# Patient Record
Sex: Female | Born: 1960 | Race: White | Hispanic: No | Marital: Married | Smoking: Never smoker
Health system: Southern US, Community
[De-identification: ages and names within clinical notes are randomized; demographics above are authoritative.]

## PROBLEM LIST (undated history)

## (undated) DIAGNOSIS — E079 Disorder of thyroid, unspecified: Secondary | ICD-10-CM

## (undated) DIAGNOSIS — J309 Allergic rhinitis, unspecified: Secondary | ICD-10-CM

## (undated) DIAGNOSIS — F39 Unspecified mood [affective] disorder: Secondary | ICD-10-CM

## (undated) DIAGNOSIS — Z9889 Other specified postprocedural states: Secondary | ICD-10-CM

## (undated) DIAGNOSIS — F419 Anxiety disorder, unspecified: Secondary | ICD-10-CM

## (undated) DIAGNOSIS — F3281 Premenstrual dysphoric disorder: Secondary | ICD-10-CM

## (undated) DIAGNOSIS — I872 Venous insufficiency (chronic) (peripheral): Secondary | ICD-10-CM

## (undated) DIAGNOSIS — I700 Atherosclerosis of aorta: Secondary | ICD-10-CM

## (undated) DIAGNOSIS — N183 Chronic kidney disease, stage 3 unspecified: Secondary | ICD-10-CM

## (undated) DIAGNOSIS — R112 Nausea with vomiting, unspecified: Secondary | ICD-10-CM

## (undated) HISTORY — DX: Unspecified mood (affective) disorder: F39

## (undated) HISTORY — DX: Allergic rhinitis, unspecified: J30.9

## (undated) HISTORY — PX: TUBAL LIGATION: SHX77

## (undated) HISTORY — DX: Chronic kidney disease, stage 3 unspecified: N18.30

## (undated) HISTORY — DX: Venous insufficiency (chronic) (peripheral): I87.2

## (undated) HISTORY — DX: Premenstrual dysphoric disorder: F32.81

## (undated) HISTORY — PX: TONSILLECTOMY: SUR1361

## (undated) HISTORY — PX: COLONOSCOPY: SHX174

## (undated) HISTORY — PX: APPENDECTOMY: SHX54

## (undated) HISTORY — DX: Atherosclerosis of aorta: I70.0

## (undated) HISTORY — PX: DILATION AND CURETTAGE OF UTERUS: SHX78

---

## 1998-04-18 ENCOUNTER — Other Ambulatory Visit: Admission: RE | Admit: 1998-04-18 | Discharge: 1998-04-18 | Payer: Self-pay | Admitting: Obstetrics & Gynecology

## 1999-03-20 ENCOUNTER — Encounter: Payer: Self-pay | Admitting: Obstetrics & Gynecology

## 1999-03-20 ENCOUNTER — Ambulatory Visit (HOSPITAL_COMMUNITY): Admission: RE | Admit: 1999-03-20 | Discharge: 1999-03-20 | Payer: Self-pay | Admitting: Obstetrics & Gynecology

## 1999-04-22 ENCOUNTER — Other Ambulatory Visit: Admission: RE | Admit: 1999-04-22 | Discharge: 1999-04-22 | Payer: Self-pay | Admitting: Obstetrics & Gynecology

## 2000-05-19 ENCOUNTER — Other Ambulatory Visit: Admission: RE | Admit: 2000-05-19 | Discharge: 2000-05-19 | Payer: Self-pay | Admitting: Obstetrics & Gynecology

## 2001-06-15 ENCOUNTER — Other Ambulatory Visit: Admission: RE | Admit: 2001-06-15 | Discharge: 2001-06-15 | Payer: Self-pay | Admitting: Obstetrics & Gynecology

## 2002-06-28 ENCOUNTER — Other Ambulatory Visit: Admission: RE | Admit: 2002-06-28 | Discharge: 2002-06-28 | Payer: Self-pay | Admitting: Obstetrics & Gynecology

## 2002-07-22 ENCOUNTER — Encounter (INDEPENDENT_AMBULATORY_CARE_PROVIDER_SITE_OTHER): Payer: Self-pay | Admitting: *Deleted

## 2002-07-22 ENCOUNTER — Ambulatory Visit (HOSPITAL_COMMUNITY): Admission: RE | Admit: 2002-07-22 | Discharge: 2002-07-22 | Payer: Self-pay | Admitting: Obstetrics & Gynecology

## 2003-07-05 ENCOUNTER — Other Ambulatory Visit: Admission: RE | Admit: 2003-07-05 | Discharge: 2003-07-05 | Payer: Self-pay | Admitting: Obstetrics & Gynecology

## 2003-12-01 ENCOUNTER — Emergency Department (HOSPITAL_COMMUNITY): Admission: EM | Admit: 2003-12-01 | Discharge: 2003-12-01 | Payer: Self-pay | Admitting: Family Medicine

## 2004-06-29 ENCOUNTER — Emergency Department (HOSPITAL_COMMUNITY): Admission: EM | Admit: 2004-06-29 | Discharge: 2004-06-29 | Payer: Self-pay | Admitting: Family Medicine

## 2004-08-13 ENCOUNTER — Other Ambulatory Visit: Admission: RE | Admit: 2004-08-13 | Discharge: 2004-08-13 | Payer: Self-pay | Admitting: Obstetrics & Gynecology

## 2004-08-21 ENCOUNTER — Encounter: Admission: RE | Admit: 2004-08-21 | Discharge: 2004-08-21 | Payer: Self-pay | Admitting: Obstetrics & Gynecology

## 2005-09-09 ENCOUNTER — Other Ambulatory Visit: Admission: RE | Admit: 2005-09-09 | Discharge: 2005-09-09 | Payer: Self-pay | Admitting: Obstetrics & Gynecology

## 2005-09-26 ENCOUNTER — Encounter: Admission: RE | Admit: 2005-09-26 | Discharge: 2005-09-26 | Payer: Self-pay | Admitting: Obstetrics & Gynecology

## 2007-05-24 ENCOUNTER — Ambulatory Visit (HOSPITAL_COMMUNITY): Admission: RE | Admit: 2007-05-24 | Discharge: 2007-05-24 | Payer: Self-pay | Admitting: Orthopedic Surgery

## 2008-04-28 ENCOUNTER — Encounter: Admission: RE | Admit: 2008-04-28 | Discharge: 2008-04-28 | Payer: Self-pay | Admitting: Obstetrics & Gynecology

## 2008-12-24 IMAGING — MG MM DIAGNOSTIC UNILATERAL L
3 series · 3 of 3 positions shown · non-contrast
Comparison: With priors

CLINICAL DATA: Abnormal left screening mammogram

DIGITAL DIAGNOSTIC  LEFT  MAMMOGRAM  WITH CAD AND LEFT BREAST
ULTRASOUND:

[L CC]
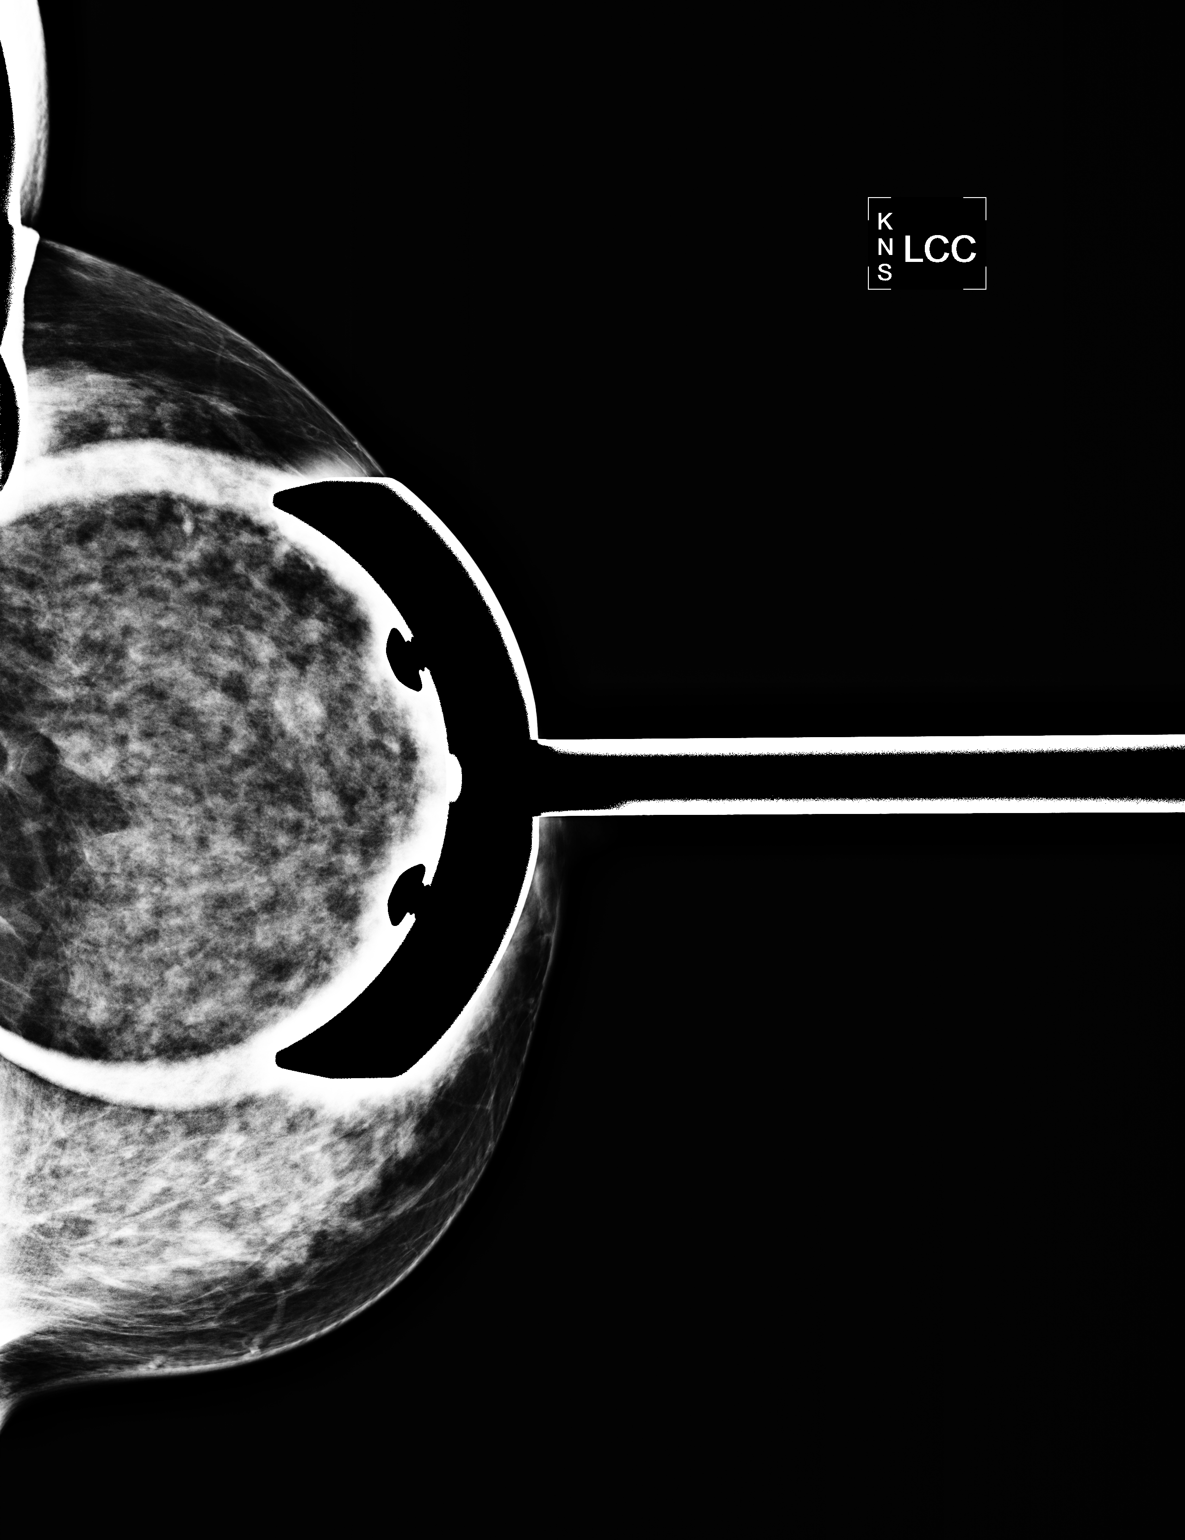

[L MLO]
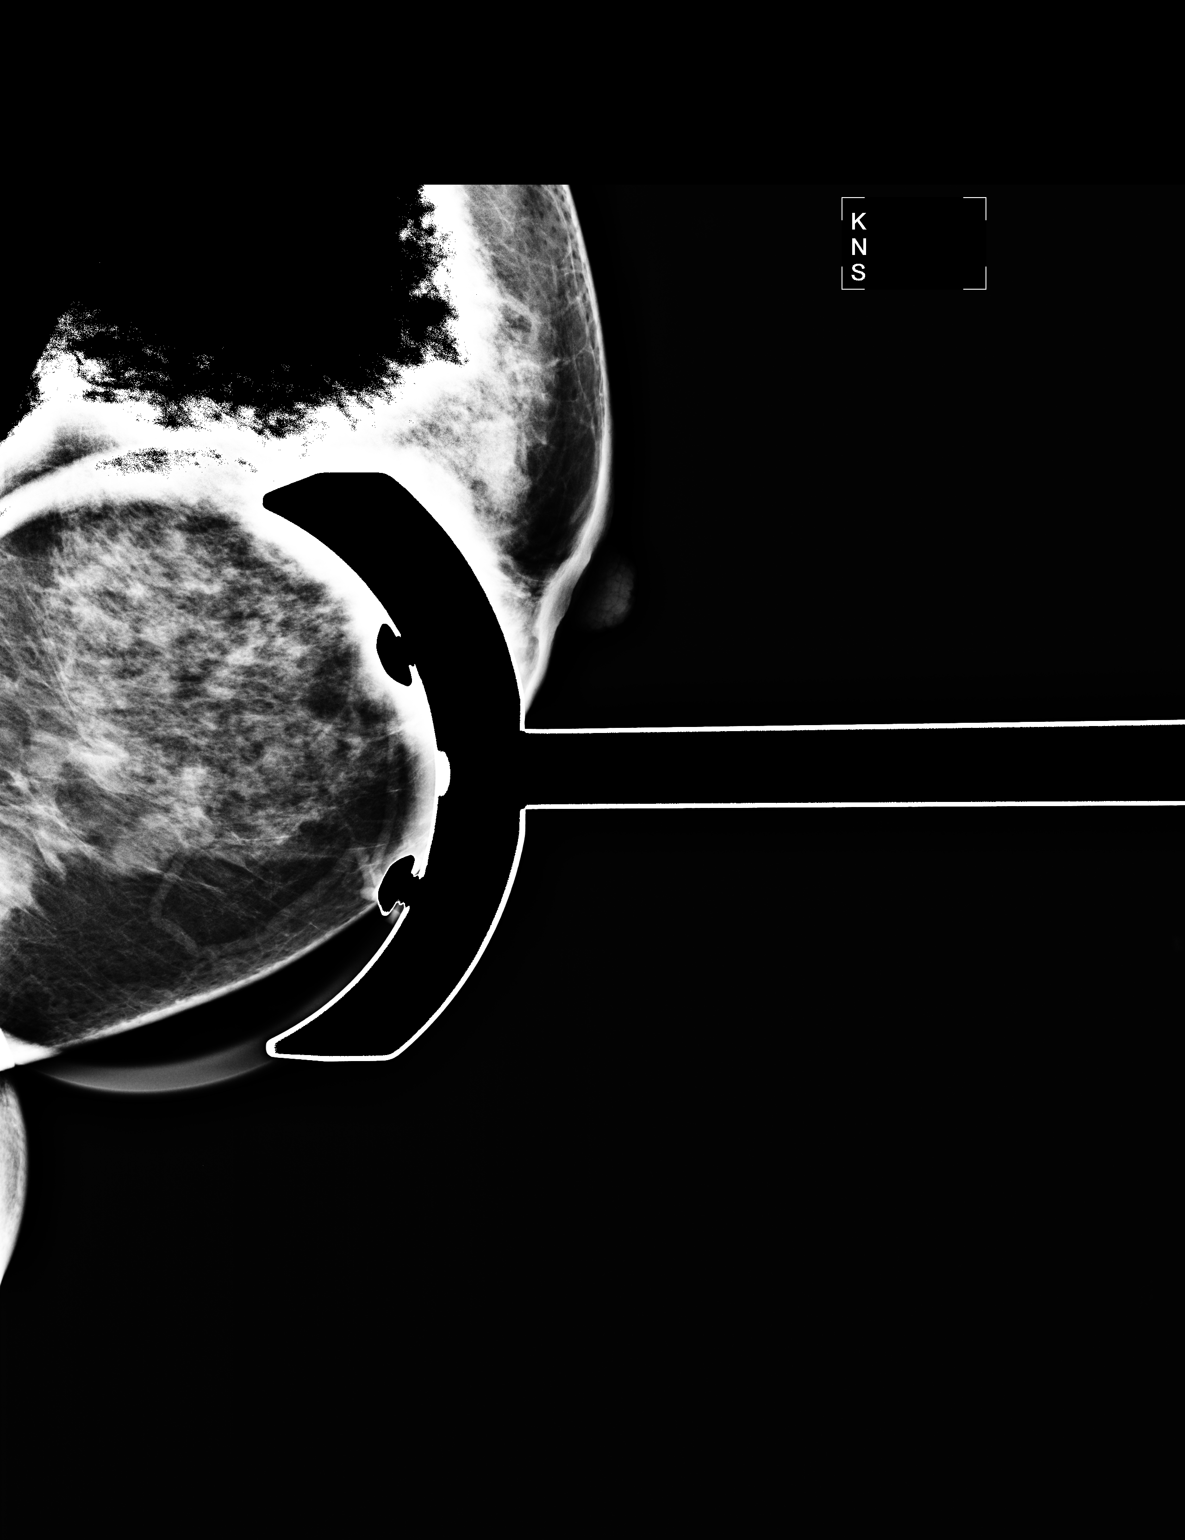

[L ML]
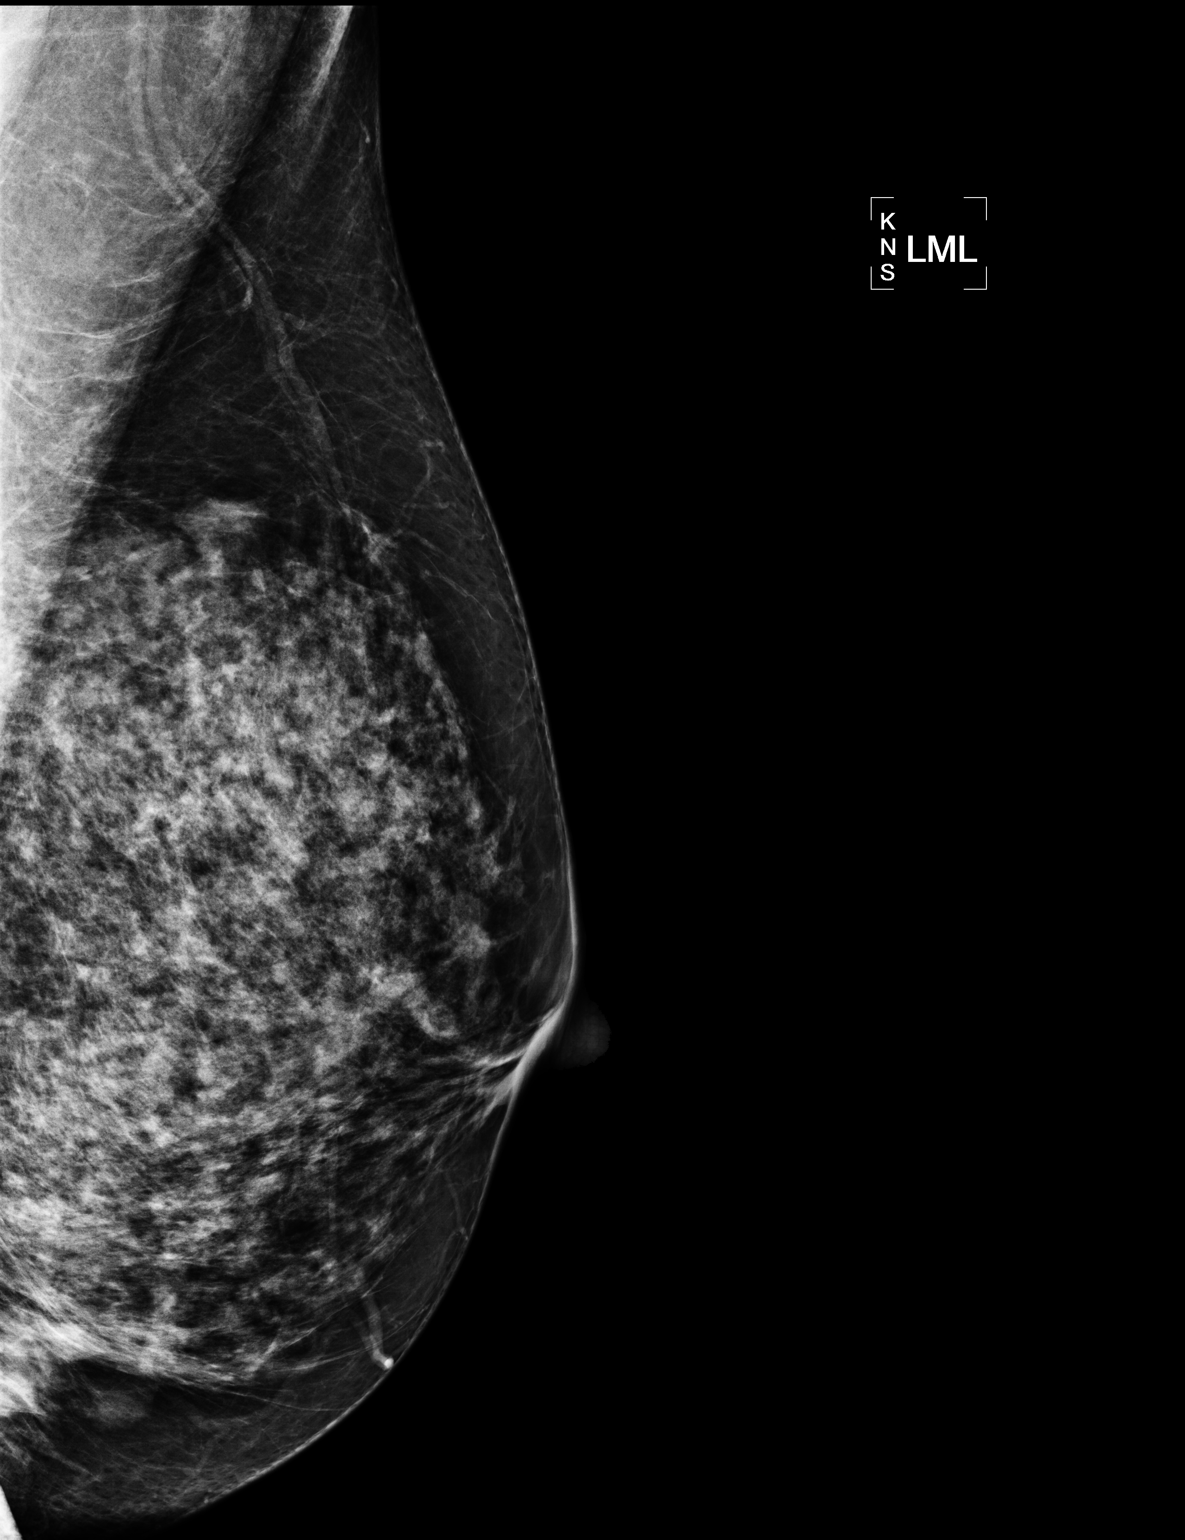

[3 of 3 positions shown; findings below may reference images not displayed]

FINDINGS: There is a dense  fibronodular pattern.  The nodularity
in the inferior aspect of the left breast is not as pronounced on
the true lateral and spot compression views.  There is no malignant-
type microcalcifications.

On physical exam, I do not palpate a mass in the left breast.

Ultrasound is performed, showing normal tissue in the inferior
aspect of the breast.  No solid or cystic mass, abnormal shadowing
or distortion visualized.
IMPRESSION: Probable benign findings in the left breast.  Short-term interval
follow-up left mammogram in 6 months is recommended.

BI-RADS CATEGORY 3:  Probably benign finding(s) - short interval
follow-up suggested.

Left diagnostic mammogram in 6 months is recommended.

## 2009-01-23 ENCOUNTER — Encounter: Admission: RE | Admit: 2009-01-23 | Discharge: 2009-01-23 | Payer: Self-pay | Admitting: Obstetrics & Gynecology

## 2009-06-04 DIAGNOSIS — F39 Unspecified mood [affective] disorder: Secondary | ICD-10-CM | POA: Insufficient documentation

## 2009-06-04 DIAGNOSIS — J309 Allergic rhinitis, unspecified: Secondary | ICD-10-CM | POA: Insufficient documentation

## 2009-06-04 DIAGNOSIS — N943 Premenstrual tension syndrome: Secondary | ICD-10-CM | POA: Insufficient documentation

## 2009-06-04 DIAGNOSIS — I872 Venous insufficiency (chronic) (peripheral): Secondary | ICD-10-CM | POA: Insufficient documentation

## 2009-06-20 DIAGNOSIS — D72829 Elevated white blood cell count, unspecified: Secondary | ICD-10-CM | POA: Insufficient documentation

## 2009-10-23 DIAGNOSIS — D509 Iron deficiency anemia, unspecified: Secondary | ICD-10-CM | POA: Insufficient documentation

## 2010-10-18 NOTE — Op Note (Signed)
   NAME:  Vicki Abbott, Vicki Abbott                      ACCOUNT NO.:  [id number]   MEDICAL RECORD NO.:  [id number]                   PATIENT TYPE:  AMB   LOCATION:  SDC                                  FACILITY:  [facility]   PHYSICIAN:  Freddy Finner, M.D.                DATE OF BIRTH:  Jan 31, 1961   DATE OF PROCEDURE:  07/22/2002  DATE OF DISCHARGE:                                 OPERATIVE REPORT   PREOPERATIVE DIAGNOSES:  1. Dysfunctional uterine bleeding  2. Uterine fibroids.  3. Probable endometrial polyp noted on sonohistogram.   POSTOPERATIVE DIAGNOSES:  1. Endometrial polyp.  2. Normal size endometrial cavity.  3. Uterine enlargement due to fibroids.   OPERATION:  1. Hysteroscopy dilatation and curettage.  2. Resection of polyp.   SURGEON:  Freddy Finner, M.D.   ANESTHESIA:  Managed intravenous sedation, paracervical block performed by  me with 1% Xylocaine with injections at 4 at 8 o'clock in the vagina  fornices.   ESTIMATED BLOOD LOSS:  Less than 20 ml.   SORBITOL DEFICIT:  25 ml.   COMPLICATIONS:  None.   BRIEF HISTORY:  The patient is a 50 year old who had dysfunctional uterine  bleeding, had a sonohistogram showing polyp in the office.  She is know to  have uterine fibroids.  She is admitted now for hysteroscopy D&C.   DESCRIPTION OF PROCEDURE:  She was brought to the operating room, there  placed adequate intravenous sedation, placed in dorsolithotomy position  using Allen stirrup system.  Betadine prep of peritoneum and vagina was  carried out.  Sterile drapes were applied.  Bivalve speculum was introduced.  Paracervical block was placed as noted above.  Cervix was grasped on the  anterior lip with a single tooth tenaculum.  Cervix was progressively  dilated to 23 with Shawnie Pons.  A 12.5 degree [facility] hysteroscope was introduced  using 3% sorbitol as distending medium.  Inspection of the cavity revealed a  polyp on the anterior lower uterine segment.  No other  abnormalities were  noted.  The uterus  sounded to 8 cm.  Gentle and thorough curettage and exploration with polyp  forceps was performed, and adequate resection of the polyp was accomplished  and sampling of the endometrium.  The instruments were removed.  At this  point, the procedure was terminated.  The patient was taken to recovery in  good condition.                                               Freddy Finner, M.D.    WRN/MEDQ  D:  07/22/2002  T:  07/22/2002  Job:  [id number]

## 2012-01-12 ENCOUNTER — Other Ambulatory Visit: Payer: Self-pay | Admitting: Obstetrics & Gynecology

## 2012-03-05 ENCOUNTER — Other Ambulatory Visit: Payer: Self-pay | Admitting: Dermatology

## 2012-03-08 DIAGNOSIS — R5383 Other fatigue: Secondary | ICD-10-CM | POA: Insufficient documentation

## 2012-03-15 ENCOUNTER — Encounter (HOSPITAL_COMMUNITY): Payer: Self-pay | Admitting: *Deleted

## 2012-03-15 ENCOUNTER — Encounter (HOSPITAL_COMMUNITY)
Admission: EM | Disposition: A | Discharge status: Home / Self Care | Payer: Self-pay | Source: Home / Self Care | Attending: Emergency Medicine

## 2012-03-15 ENCOUNTER — Emergency Department (HOSPITAL_COMMUNITY): Payer: BC Managed Care – PPO

## 2012-03-15 ENCOUNTER — Encounter (HOSPITAL_COMMUNITY): Payer: Self-pay | Admitting: Anesthesiology

## 2012-03-15 ENCOUNTER — Emergency Department (HOSPITAL_COMMUNITY): Payer: BC Managed Care – PPO | Admitting: Anesthesiology

## 2012-03-15 ENCOUNTER — Observation Stay (HOSPITAL_COMMUNITY)
Admission: EM | Admit: 2012-03-15 | Discharge: 2012-03-16 | Disposition: A | Discharge status: Home / Self Care | Payer: BC Managed Care – PPO | Attending: General Surgery | Admitting: General Surgery

## 2012-03-15 DIAGNOSIS — K3580 Unspecified acute appendicitis: Secondary | ICD-10-CM

## 2012-03-15 DIAGNOSIS — E079 Disorder of thyroid, unspecified: Secondary | ICD-10-CM | POA: Diagnosis present

## 2012-03-15 DIAGNOSIS — E039 Hypothyroidism, unspecified: Secondary | ICD-10-CM | POA: Insufficient documentation

## 2012-03-15 HISTORY — DX: Disorder of thyroid, unspecified: E07.9

## 2012-03-15 HISTORY — DX: Anxiety disorder, unspecified: F41.9

## 2012-03-15 HISTORY — PX: LAPAROSCOPIC APPENDECTOMY: SHX408

## 2012-03-15 LAB — COMPREHENSIVE METABOLIC PANEL
Alkaline Phosphatase: 44 U/L (ref 39–117)
BUN: 17 mg/dL (ref 6–23)
CO2: 29 mEq/L (ref 19–32)
Chloride: 101 mEq/L (ref 96–112)
Creatinine, Ser: 0.91 mg/dL (ref 0.50–1.10)
GFR calc Af Amer: 82 mL/min — ABNORMAL LOW (ref >90)
Glucose, Bld: 124 mg/dL — ABNORMAL HIGH (ref 70–99)
Potassium: 4 mEq/L (ref 3.5–5.1)
Sodium: 139 mEq/L (ref 135–145)

## 2012-03-15 LAB — URINALYSIS, ROUTINE W REFLEX MICROSCOPIC
Bilirubin Urine: NEGATIVE
Ketones, ur: NEGATIVE mg/dL
Leukocytes, UA: NEGATIVE
Protein, ur: NEGATIVE mg/dL
Urobilinogen, UA: 0.2 mg/dL (ref 0.0–1.0)
pH: 5 (ref 5.0–8.0)

## 2012-03-15 LAB — CBC WITH DIFFERENTIAL/PLATELET
Eosinophils Absolute: 0.1 10*3/uL (ref 0.0–0.7)
Eosinophils Relative: 1 % (ref 0–5)
Hemoglobin: 12.7 g/dL (ref 12.0–15.0)
MCH: 32.3 pg (ref 26.0–34.0)
Monocytes Absolute: 0.8 10*3/uL (ref 0.1–1.0)
Neutrophils Relative %: 81 % — ABNORMAL HIGH (ref 43–77)
Platelets: 208 10*3/uL (ref 150–400)
WBC: 14.6 10*3/uL — ABNORMAL HIGH (ref 4.0–10.5)

## 2012-03-15 LAB — CBC
HCT: 34.4 % — ABNORMAL LOW (ref 36.0–46.0)
MCHC: 34.3 g/dL (ref 30.0–36.0)
RDW: 12.3 % (ref 11.5–15.5)

## 2012-03-15 LAB — CREATININE, SERUM
Creatinine, Ser: 0.81 mg/dL (ref 0.50–1.10)
GFR calc non Af Amer: 81 mL/min — ABNORMAL LOW (ref >90)

## 2012-03-15 SURGERY — APPENDECTOMY, LAPAROSCOPIC
Anesthesia: General | Wound class: Contaminated

## 2012-03-15 MED ORDER — OXYCODONE-ACETAMINOPHEN 5-325 MG PO TABS
1.0000 | ORAL_TABLET | ORAL | Status: DC | PRN
  Administered 2012-03-16: 2 via ORAL
  Filled 2012-03-15: qty 2

## 2012-03-15 MED ORDER — SUCCINYLCHOLINE CHLORIDE 20 MG/ML IJ SOLN
INTRAMUSCULAR | Status: DC | PRN
  Administered 2012-03-15: 100 mg via INTRAVENOUS

## 2012-03-15 MED ORDER — BUPIVACAINE-EPINEPHRINE 0.25% -1:200000 IJ SOLN
INTRAMUSCULAR | Status: DC | PRN
  Administered 2012-03-15: 14 mL

## 2012-03-15 MED ORDER — IOHEXOL 300 MG/ML  SOLN
100.0000 mL | Freq: Once | INTRAMUSCULAR | Status: AC | PRN
  Administered 2012-03-15: 100 mL via INTRAVENOUS

## 2012-03-15 MED ORDER — 0.9 % SODIUM CHLORIDE (POUR BTL) OPTIME
TOPICAL | Status: DC | PRN
  Administered 2012-03-15: 1000 mL

## 2012-03-15 MED ORDER — ONDANSETRON HCL 4 MG PO TABS: 4.0000 mg | ORAL_TABLET | Freq: Four times a day (QID) | ORAL | Status: DC | PRN

## 2012-03-15 MED ORDER — LACTATED RINGERS IV SOLN
INTRAVENOUS | Status: DC | PRN
  Administered 2012-03-15 (×2): via INTRAVENOUS

## 2012-03-15 MED ORDER — GLYCOPYRROLATE 0.2 MG/ML IJ SOLN
INTRAMUSCULAR | Status: DC | PRN
  Administered 2012-03-15: 0.4 mg via INTRAVENOUS

## 2012-03-15 MED ORDER — HYDROMORPHONE HCL PF 1 MG/ML IJ SOLN
INTRAMUSCULAR | Status: AC
Start: 2012-03-15 — End: 2012-03-15
  Filled 2012-03-15: qty 1

## 2012-03-15 MED ORDER — METRONIDAZOLE IN NACL 5-0.79 MG/ML-% IV SOLN
500.0000 mg | Freq: Once | INTRAVENOUS | Status: AC
  Administered 2012-03-15: 500 mg via INTRAVENOUS
  Filled 2012-03-15: qty 100

## 2012-03-15 MED ORDER — SODIUM CHLORIDE 0.9 % IV SOLN: INTRAVENOUS | Status: DC

## 2012-03-15 MED ORDER — LEVOTHYROXINE SODIUM 112 MCG PO TABS
112.0000 ug | ORAL_TABLET | Freq: Every day | ORAL | Status: DC
  Administered 2012-03-16: 112 ug via ORAL
  Filled 2012-03-15 (×3): qty 1

## 2012-03-15 MED ORDER — MENTHOL 3 MG MT LOZG
1.0000 | LOZENGE | OROMUCOSAL | Status: DC | PRN
  Administered 2012-03-15: 3 mg via ORAL
  Filled 2012-03-15: qty 9

## 2012-03-15 MED ORDER — OXYCODONE HCL 5 MG/5ML PO SOLN: 5.0000 mg | Freq: Once | ORAL | Status: DC | PRN

## 2012-03-15 MED ORDER — LIDOCAINE HCL (CARDIAC) 20 MG/ML IV SOLN
INTRAVENOUS | Status: DC | PRN
  Administered 2012-03-15: 100 mg via INTRAVENOUS

## 2012-03-15 MED ORDER — MORPHINE SULFATE 2 MG/ML IJ SOLN
2.0000 mg | INTRAMUSCULAR | Status: DC | PRN
Start: 2012-03-15 — End: 2012-03-16
  Administered 2012-03-15: 2 mg via INTRAVENOUS
  Administered 2012-03-15 – 2012-03-16 (×2): 4 mg via INTRAVENOUS
  Filled 2012-03-15: qty 1
  Filled 2012-03-15 (×2): qty 2

## 2012-03-15 MED ORDER — LACTATED RINGERS IV SOLN
INTRAVENOUS | Status: DC
  Administered 2012-03-15: 09:00:00 via INTRAVENOUS

## 2012-03-15 MED ORDER — HEPARIN SODIUM (PORCINE) 5000 UNIT/ML IJ SOLN
5000.0000 [IU] | Freq: Three times a day (TID) | INTRAMUSCULAR | Status: DC
  Administered 2012-03-16: 5000 [IU] via SUBCUTANEOUS
  Filled 2012-03-15 (×4): qty 1

## 2012-03-15 MED ORDER — ONDANSETRON HCL 4 MG/2ML IJ SOLN
4.0000 mg | Freq: Four times a day (QID) | INTRAMUSCULAR | Status: DC | PRN
  Administered 2012-03-15 (×2): 4 mg via INTRAVENOUS
  Filled 2012-03-15 (×2): qty 2

## 2012-03-15 MED ORDER — IOHEXOL 300 MG/ML  SOLN: 20.0000 mL | INTRAMUSCULAR | Status: DC

## 2012-03-15 MED ORDER — HYDROMORPHONE HCL PF 1 MG/ML IJ SOLN
1.0000 mg | Freq: Once | INTRAMUSCULAR | Status: AC
  Administered 2012-03-15: 1 mg via INTRAVENOUS
  Filled 2012-03-15: qty 1

## 2012-03-15 MED ORDER — FENTANYL CITRATE 0.05 MG/ML IJ SOLN
INTRAMUSCULAR | Status: DC | PRN
  Administered 2012-03-15: 100 ug via INTRAVENOUS
  Administered 2012-03-15 (×2): 50 ug via INTRAVENOUS

## 2012-03-15 MED ORDER — ACETAMINOPHEN 325 MG PO TABS: 650.0000 mg | ORAL_TABLET | ORAL | Status: DC | PRN

## 2012-03-15 MED ORDER — LORATADINE 10 MG PO TABS
10.0000 mg | ORAL_TABLET | Freq: Every day | ORAL | Status: DC | PRN
  Filled 2012-03-15: qty 1

## 2012-03-15 MED ORDER — MIDAZOLAM HCL 5 MG/5ML IJ SOLN
INTRAMUSCULAR | Status: DC | PRN
  Administered 2012-03-15: 2 mg via INTRAVENOUS

## 2012-03-15 MED ORDER — HYDROMORPHONE HCL PF 1 MG/ML IJ SOLN
0.2500 mg | INTRAMUSCULAR | Status: DC | PRN
  Administered 2012-03-15 (×2): 0.5 mg via INTRAVENOUS

## 2012-03-15 MED ORDER — METOCLOPRAMIDE HCL 5 MG/ML IJ SOLN
10.0000 mg | Freq: Once | INTRAMUSCULAR | Status: AC
  Administered 2012-03-15: 10 mg via INTRAVENOUS
  Filled 2012-03-15: qty 2

## 2012-03-15 MED ORDER — ONDANSETRON HCL 4 MG/2ML IJ SOLN
4.0000 mg | Freq: Once | INTRAMUSCULAR | Status: AC
  Administered 2012-03-15: 4 mg via INTRAVENOUS
  Filled 2012-03-15: qty 2

## 2012-03-15 MED ORDER — ESTROGENS CONJUGATED 1.25 MG PO TABS
1.2500 mg | ORAL_TABLET | Freq: Every day | ORAL | Status: DC
  Administered 2012-03-15: 1.25 mg via ORAL
  Filled 2012-03-15 (×2): qty 1

## 2012-03-15 MED ORDER — CIPROFLOXACIN IN D5W 400 MG/200ML IV SOLN
400.0000 mg | Freq: Once | INTRAVENOUS | Status: AC
  Administered 2012-03-15: 400 mg via INTRAVENOUS
  Filled 2012-03-15: qty 200

## 2012-03-15 MED ORDER — ONDANSETRON HCL 4 MG/2ML IJ SOLN: 4.0000 mg | Freq: Four times a day (QID) | INTRAMUSCULAR | Status: DC | PRN

## 2012-03-15 MED ORDER — BUPROPION HCL ER (XL) 300 MG PO TB24
300.0000 mg | ORAL_TABLET | Freq: Every day | ORAL | Status: DC
  Administered 2012-03-16: 300 mg via ORAL
  Filled 2012-03-15 (×3): qty 1

## 2012-03-15 MED ORDER — PROPOFOL 10 MG/ML IV BOLUS
INTRAVENOUS | Status: DC | PRN
  Administered 2012-03-15: 200 mg via INTRAVENOUS

## 2012-03-15 MED ORDER — METOCLOPRAMIDE HCL 5 MG/ML IJ SOLN: 10.0000 mg | Freq: Once | INTRAMUSCULAR | Status: DC | PRN

## 2012-03-15 MED ORDER — MORPHINE SULFATE 2 MG/ML IJ SOLN
2.0000 mg | INTRAMUSCULAR | Status: DC | PRN
  Administered 2012-03-15: 2 mg via INTRAVENOUS
  Filled 2012-03-15: qty 1

## 2012-03-15 MED ORDER — ROCURONIUM BROMIDE 100 MG/10ML IV SOLN
INTRAVENOUS | Status: DC | PRN
  Administered 2012-03-15: 25 mg via INTRAVENOUS

## 2012-03-15 MED ORDER — KCL IN DEXTROSE-NACL 20-5-0.45 MEQ/L-%-% IV SOLN
INTRAVENOUS | Status: DC
  Administered 2012-03-15: 1000 mL via INTRAVENOUS
  Administered 2012-03-16: via INTRAVENOUS
  Filled 2012-03-15 (×5): qty 1000

## 2012-03-15 MED ORDER — OXYCODONE HCL 5 MG PO TABS: 5.0000 mg | ORAL_TABLET | Freq: Once | ORAL | Status: DC | PRN

## 2012-03-15 MED ORDER — SODIUM CHLORIDE 0.9 % IR SOLN
Status: DC | PRN
  Administered 2012-03-15: 1000 mL

## 2012-03-15 MED ORDER — ONDANSETRON HCL 4 MG/2ML IJ SOLN
INTRAMUSCULAR | Status: DC | PRN
  Administered 2012-03-15: 4 mg via INTRAVENOUS

## 2012-03-15 MED ORDER — PROMETHAZINE HCL 25 MG/ML IJ SOLN
25.0000 mg | Freq: Four times a day (QID) | INTRAMUSCULAR | Status: DC | PRN
  Administered 2012-03-15: 25 mg via INTRAVENOUS
  Filled 2012-03-15: qty 1

## 2012-03-15 MED ORDER — NEOSTIGMINE METHYLSULFATE 1 MG/ML IJ SOLN
INTRAMUSCULAR | Status: DC | PRN
  Administered 2012-03-15: 3 mg via INTRAVENOUS

## 2012-03-15 SURGICAL SUPPLY — 53 items
ADH SKN CLS APL DERMABOND .7 (GAUZE/BANDAGES/DRESSINGS) ×1
APPLIER CLIP ROT 10 11.4 M/L (STAPLE)
APR CLP MED LRG 11.4X10 (STAPLE)
BAG SPEC RTRVL LRG 6X4 10 (ENDOMECHANICALS) ×1
BLADE SURG ROTATE 9660 (MISCELLANEOUS) IMPLANT
CANISTER SUCTION 2500CC (MISCELLANEOUS) ×2 IMPLANT
CHLORAPREP W/TINT 26ML (MISCELLANEOUS) ×2 IMPLANT
CLIP APPLIE ROT 10 11.4 M/L (STAPLE) IMPLANT
CLOTH BEACON ORANGE TIMEOUT ST (SAFETY) ×2 IMPLANT
COVER SURGICAL LIGHT HANDLE (MISCELLANEOUS) ×2 IMPLANT
CUTTER LINEAR ENDO 35 ETS (STAPLE) IMPLANT
CUTTER LINEAR ENDO 35 ETS TH (STAPLE) IMPLANT
DECANTER SPIKE VIAL GLASS SM (MISCELLANEOUS) ×2 IMPLANT
DERMABOND ADVANCED (GAUZE/BANDAGES/DRESSINGS) ×1
DERMABOND ADVANCED .7 DNX12 (GAUZE/BANDAGES/DRESSINGS) ×1 IMPLANT
DRAPE UTILITY 15X26 W/TAPE STR (DRAPE) ×4 IMPLANT
ELECT REM PT RETURN 9FT ADLT (ELECTROSURGICAL) ×2
ELECTRODE REM PT RTRN 9FT ADLT (ELECTROSURGICAL) ×1 IMPLANT
ENDOLOOP SUT PDS II  0 18 (SUTURE)
ENDOLOOP SUT PDS II 0 18 (SUTURE) IMPLANT
GLOVE BIO SURGEON STRL SZ7.5 (GLOVE) ×1 IMPLANT
GLOVE BIO SURGEON STRL SZ8 (GLOVE) ×2 IMPLANT
GLOVE BIOGEL PI IND STRL 7.0 (GLOVE) IMPLANT
GLOVE BIOGEL PI IND STRL 7.5 (GLOVE) IMPLANT
GLOVE BIOGEL PI IND STRL 8 (GLOVE) ×1 IMPLANT
GLOVE BIOGEL PI INDICATOR 7.0 (GLOVE) ×1
GLOVE BIOGEL PI INDICATOR 7.5 (GLOVE) ×1
GLOVE BIOGEL PI INDICATOR 8 (GLOVE) ×1
GLOVE EXAM NITRILE MICROT MD (GLOVE) ×1 IMPLANT
GLOVE SURG SS PI 6.5 STRL IVOR (GLOVE) ×1 IMPLANT
GOWN PREVENTION PLUS XLARGE (GOWN DISPOSABLE) ×2 IMPLANT
GOWN STRL NON-REIN LRG LVL3 (GOWN DISPOSABLE) ×4 IMPLANT
KIT BASIN OR (CUSTOM PROCEDURE TRAY) ×2 IMPLANT
KIT ROOM TURNOVER OR (KITS) ×2 IMPLANT
NS IRRIG 1000ML POUR BTL (IV SOLUTION) ×2 IMPLANT
PAD ARMBOARD 7.5X6 YLW CONV (MISCELLANEOUS) ×4 IMPLANT
POUCH SPECIMEN RETRIEVAL 10MM (ENDOMECHANICALS) ×2 IMPLANT
RELOAD /EVU35 (ENDOMECHANICALS) IMPLANT
RELOAD CUTTER ETS 35MM STAND (ENDOMECHANICALS) IMPLANT
SCALPEL HARMONIC ACE (MISCELLANEOUS) ×2 IMPLANT
SET IRRIG TUBING LAPAROSCOPIC (IRRIGATION / IRRIGATOR) ×2 IMPLANT
SPECIMEN JAR SMALL (MISCELLANEOUS) ×2 IMPLANT
SUT VIC AB 4-0 PS2 27 (SUTURE) ×2 IMPLANT
SWAB COLLECTION DEVICE MRSA (MISCELLANEOUS) IMPLANT
TOWEL OR 17X24 6PK STRL BLUE (TOWEL DISPOSABLE) ×2 IMPLANT
TOWEL OR 17X26 10 PK STRL BLUE (TOWEL DISPOSABLE) ×2 IMPLANT
TRAY FOLEY CATH 14FR (SET/KITS/TRAYS/PACK) ×2 IMPLANT
TRAY LAPAROSCOPIC (CUSTOM PROCEDURE TRAY) ×2 IMPLANT
TROCAR HASSON GELL 12X100 (TROCAR) ×2 IMPLANT
TROCAR Z-THREAD FIOS 12X100MM (TROCAR) ×2 IMPLANT
TROCAR Z-THREAD FIOS 5X100MM (TROCAR) ×2 IMPLANT
TUBE ANAEROBIC SPECIMEN COL (MISCELLANEOUS) IMPLANT
WATER STERILE IRR 1000ML POUR (IV SOLUTION) ×2 IMPLANT

## 2012-03-15 NOTE — H&P (Signed)
Vicki Abbott is an 51 y.o. female.   Chief Complaint: abdominal pain referred by Dr. Dierdre Highman HPI: 56 yof who began having abdominal pain last night now worsening and localized to rlq.  She did not have a fever.  Did have nausea and some emesis.  She has prior history of normal colonoscopy.  Pain made worse with palpation and movement.    Past Medical History  Diagnosis Date  . Thyroid disease   . Anxiety     Past Surgical History  Procedure Date  . Tubal ligation   . Dilation and curettage of uterus     No family history on file. Social History:  reports that she has never smoked. She does not have any smokeless tobacco history on file. She reports that she drinks alcohol. She reports that she does not use illicit drugs.  Allergies:  Allergies  Allergen Reactions  . Penicillins Rash  . Sulfa Antibiotics Rash  medications: synthroid  Results for orders placed during the hospital encounter of 03/15/12 (from the past 48 hour(s))  COMPREHENSIVE METABOLIC PANEL     Status: Abnormal   Collection Time   03/15/12  2:42 AM      Component Value Range Comment   Sodium 139  135 - 145 mEq/L    Potassium 4.0  3.5 - 5.1 mEq/L    Chloride 101  96 - 112 mEq/L    CO2 29  19 - 32 mEq/L    Glucose, Bld 124 (*) 70 - 99 mg/dL    BUN 17  6 - 23 mg/dL    Creatinine, Ser 0.86  0.50 - 1.10 mg/dL    Calcium 9.8  8.4 - 57.8 mg/dL    Total Protein 6.6  6.0 - 8.3 g/dL    Albumin 3.5  3.5 - 5.2 g/dL    AST 16  0 - 37 U/L    ALT 15  0 - 35 U/L    Alkaline Phosphatase 44  39 - 117 U/L    Total Bilirubin 0.2 (*) 0.3 - 1.2 mg/dL    GFR calc non Af Amer 71 (*) >90 mL/min    GFR calc Af Amer 82 (*) >90 mL/min   CBC WITH DIFFERENTIAL     Status: Abnormal   Collection Time   03/15/12  2:42 AM      Component Value Range Comment   WBC 14.6 (*) 4.0 - 10.5 K/uL    RBC 3.93  3.87 - 5.11 MIL/uL    Hemoglobin 12.7  12.0 - 15.0 g/dL    HCT 46.9  62.9 - 52.8 %    MCV 92.9  78.0 - 100.0 fL    MCH 32.3   26.0 - 34.0 pg    MCHC 34.8  30.0 - 36.0 g/dL    RDW 41.3  24.4 - 01.0 %    Platelets 208  150 - 400 K/uL    Neutrophils Relative 81 (*) 43 - 77 %    Neutro Abs 11.8 (*) 1.7 - 7.7 K/uL    Lymphocytes Relative 13  12 - 46 %    Lymphs Abs 1.9  0.7 - 4.0 K/uL    Monocytes Relative 5  3 - 12 %    Monocytes Absolute 0.8  0.1 - 1.0 K/uL    Eosinophils Relative 1  0 - 5 %    Eosinophils Absolute 0.1  0.0 - 0.7 K/uL    Basophils Relative 0  0 - 1 %    Basophils  Absolute 0.0  0.0 - 0.1 K/uL   URINALYSIS, ROUTINE W REFLEX MICROSCOPIC     Status: Normal   Collection Time   03/15/12  4:11 AM      Component Value Range Comment   Color, Urine YELLOW  YELLOW    APPearance CLEAR  CLEAR    Specific Gravity, Urine 1.017  1.005 - 1.030    pH 5.0  5.0 - 8.0    Glucose, UA NEGATIVE  NEGATIVE mg/dL    Hgb urine dipstick NEGATIVE  NEGATIVE    Bilirubin Urine NEGATIVE  NEGATIVE    Ketones, ur NEGATIVE  NEGATIVE mg/dL    Protein, ur NEGATIVE  NEGATIVE mg/dL    Urobilinogen, UA 0.2  0.0 - 1.0 mg/dL    Nitrite NEGATIVE  NEGATIVE    Leukocytes, UA NEGATIVE  NEGATIVE MICROSCOPIC NOT DONE ON URINES WITH NEGATIVE PROTEIN, BLOOD, LEUKOCYTES, NITRITE, OR GLUCOSE <1000 mg/dL.   Ct Abdomen Pelvis W Contrast  03/15/2012  *RADIOLOGY REPORT*  Clinical Data: Periumbilical and right lower quadrant abdominal pain.  Nausea and vomiting.  Chills.  CT ABDOMEN AND PELVIS WITH CONTRAST  Technique:  Multidetector CT imaging of the abdomen and pelvis was performed following the standard protocol during bolus administration of intravenous contrast.  Contrast: 1 OMNIPAQUE IOHEXOL 300 MG/ML  SOLN, OMNIPAQUE IOHEXOL 300 MG/ML  SOLN  Comparison: None.  Findings: Mild dependent atelectasis in the lung bases.  Circumscribed low density structure in the lateral segment left lobe of liver measuring approximately 9 mm.  This likely represents a cyst. The gallbladder, spleen, pancreas, adrenal glands, kidneys, abdominal aorta, and  retroperitoneal lymph nodes are unremarkable. Small esophageal hiatal hernia.  Gastric wall is not thickened. Small bowel and colon are not abnormally distended.  No free air or free fluid in the abdomen.  Pelvis:  There is a distended fluid filled retrocecal appendix with maximal diameter of about 11 mm.  There is an appendicolith at the base of the appendix.  There is stranding in the fat around the appendix.  Changes are consistent with acute appendicitis.  No periappendiceal abscess.  Small amount of free fluid in the pelvis is likely reactive.  The uterus and ovaries are not enlarged.  The bladder is decompressed.  No significant pelvic lymphadenopathy.  No evidence of diverticulitis.  Normal alignment of the lumbar vertebrae with mild degenerative change.  IMPRESSION: Changes of acute appendicitis with appendicolith, fluid-filled distended appendix, and periappendiceal stranding.  No abscess. Small amount of free fluid in the pelvis is likely reactive.   Original Report Authenticated By: Marlon Pel, M.D.     Review of Systems  Gastrointestinal: Positive for nausea, vomiting and abdominal pain.    Blood pressure 90/67, pulse 68, temperature 98.4 F (36.9 C), temperature source Oral, resp. rate 18, last menstrual period 01/12/2012, SpO2 100.00%. Physical Exam  Vitals reviewed. Constitutional: She appears well-developed and well-nourished. She appears distressed.  Cardiovascular: Normal rate, regular rhythm and normal heart sounds.   Respiratory: Effort normal and breath sounds normal. No respiratory distress. She has no wheezes. She has no rales.  GI: Soft. Bowel sounds are normal. There is tenderness in the right lower quadrant. There is tenderness at McBurney's point. No hernia.  Skin: She is not diaphoretic.     Assessment/Plan Acute appendicitis Clinically, by labs and radiologically she has acute appendicitis.  We discussed laparoscopic appendectomy with risks being but not  limited to bleeding, infection, open procedure, injury to other structures. She understands and will  proceed with my partner Dr Janee Morn this am.  Emelia Loron 03/15/2012, 6:01 AM

## 2012-03-15 NOTE — Op Note (Signed)
03/15/2012  10:48 AM  PATIENT:  Vicki Abbott  51 y.o. female  PRE-OPERATIVE DIAGNOSIS:  appendicitis  POST-OPERATIVE DIAGNOSIS:  appendicitis  PROCEDURE:  Procedure(s): APPENDECTOMY LAPAROSCOPIC  SURGEON:  Surgeon(s): [name], MD  PHYSICIAN ASSISTANT:   ASSISTANTS: none   ANESTHESIA:   local and general  EBL:     BLOOD ADMINISTERED:none  DRAINS: none   SPECIMEN:  Excision  DISPOSITION OF SPECIMEN:  PATHOLOGY  COUNTS:  YES  DICTATION: Reubin Milan Dictation Patient presented to the emergency room with abdominal pain. Workup is consistent with acute appendicitis. She is brought for emergent appendectomy. She received intravenous antibiotics. She was identified in the preop holding area. Informed consent was obtained. She was brought to the operating room and general endotracheal anesthesia was administered by the anesthesia staff. Foley was placed by nursing staff.  Abdomen was prepped and draped in sterile fashion. We did time out procedure. Infraumbilical region was infiltrated with quarter percent Marcaine with epinephrine. Infraumbilical incision was made. Subcutaneous tissues were dissected down revealing the anterior fascia to this was divided along the midline and the peritoneal cavity was entered under direct vision. 0 Vicryl pursestring suture was placed around the fascial opening. Hassan trocar was inserted. Abdomen was insufflated with carbon dioxide in standard fashion. Under direct vision a 12 mm left lower quadrant and a 5 mm right mid abdomen port were placed. Local was used to port sites. Laparoscopic exploration revealed an inflamed laterally located appendix without perforation. The mesoappendix was divided with the harmonic scalpel achieving excellent hemostasis. The base of the appendix was intact. This was divided with an Endo GIA with vascular load. There was excellent staple line closure. Appendix was placed in an Endo Catch bag and removed from the  abdomen via the left lower quadrant port site. Abdomen was irrigated. Staple line was intact. There was no bleeding. Ports were removed under direct vision. Irrigation fluid returned clear. Pneumoperitoneum was released. Informed local fascia was closed by tying the 0 Vicryl pursestring suture with care not to trap the intra-abdominal contents. All 3 wounds were copiously irrigated and the skin of each was closed with running 4 Vicryl followed by Dermabond. All counts were correct. Patient was taken recovery in stable condition. There were no apparent competitions.  PATIENT DISPOSITION:  PACU - hemodynamically stable.   Delay start of Pharmacological VTE agent (>24hrs) due to surgical blood loss or risk of bleeding:  no  Violeta Gelinas, MD, MPH, FACS Pager: [phone]  10/14/201310:48 AM

## 2012-03-15 NOTE — Transfer of Care (Signed)
Immediate Anesthesia Transfer of Care Note  Patient: Vicki Abbott  Procedure(s) Performed: Procedure(s) (LRB) with comments: APPENDECTOMY LAPAROSCOPIC (N/A)  Patient Location: PACU  Anesthesia Type: General  Level of Consciousness: awake, alert , oriented and patient cooperative  Airway & Oxygen Therapy: Patient Spontanous Breathing  Post-op Assessment: Report given to PACU RN, Post -op Vital signs reviewed and stable and Patient moving all extremities  Post vital signs: Reviewed and stable  Complications: No apparent anesthesia complications

## 2012-03-15 NOTE — Interval H&P Note (Signed)
History and Physical Interval Note:  03/15/2012 7:28 AM  Vicki Abbott  has presented today for surgery, with the diagnosis of appendicitis  The various methods of treatment have been discussed with the patient and family. After consideration of risks, benefits and other options for treatment, the patient has consented to  Procedure(s) (LRB) with comments: APPENDECTOMY LAPAROSCOPIC (N/A) as a surgical intervention .  The patient's history has been reviewed, patient examined, no change in status, stable for surgery.  I have reviewed the patient's chart and labs.  Questions were answered to the patient's satisfaction.     Edel Rivero E

## 2012-03-15 NOTE — ED Notes (Signed)
Pt amb and returned from the bathroom

## 2012-03-15 NOTE — Anesthesia Postprocedure Evaluation (Signed)
Anesthesia Post Note  Patient: Vicki Abbott  Procedure(s) Performed: Procedure(s) (LRB): APPENDECTOMY LAPAROSCOPIC (N/A)  Anesthesia type: general  Patient location: PACU  Post pain: Pain level controlled  Post assessment: Patient's Cardiovascular Status Stable  Last Vitals:  Filed Vitals:   03/15/12 1130  BP: 109/67  Pulse: 72  Temp:   Resp: 15    Post vital signs: Reviewed and stable  Level of consciousness: sedated  Complications: No apparent anesthesia complications

## 2012-03-15 NOTE — Anesthesia Preprocedure Evaluation (Signed)
Anesthesia Evaluation  Patient identified by MRN, date of birth, ID band Patient awake    Reviewed: Allergy & Precautions, H&P , NPO status , Patient's Chart, lab work & pertinent test results, reviewed documented beta blocker date and time   Airway Mallampati: II TM Distance: >3 FB Neck ROM: full    Dental   Pulmonary neg pulmonary ROS,  breath sounds clear to auscultation        Cardiovascular negative cardio ROS  Rhythm:regular     Neuro/Psych negative neurological ROS  negative psych ROS   GI/Hepatic negative GI ROS, Neg liver ROS,   Endo/Other  Hypothyroidism   Renal/GU negative Renal ROS  negative genitourinary   Musculoskeletal   Abdominal   Peds  Hematology negative hematology ROS (+)   Anesthesia Other Findings See surgeon's H&P   Reproductive/Obstetrics negative OB ROS                           Anesthesia Physical Anesthesia Plan  ASA: II and Emergent  Anesthesia Plan: General   Post-op Pain Management:    Induction: Intravenous, Rapid sequence and Cricoid pressure planned  Airway Management Planned: Oral ETT  Additional Equipment:   Intra-op Plan:   Post-operative Plan: Extubation in OR  Informed Consent: I have reviewed the patients History and Physical, chart, labs and discussed the procedure including the risks, benefits and alternatives for the proposed anesthesia with the patient or authorized representative who has indicated his/her understanding and acceptance.   Dental Advisory Given  Plan Discussed with: CRNA and Surgeon  Anesthesia Plan Comments:         Anesthesia Quick Evaluation

## 2012-03-15 NOTE — ED Notes (Signed)
Dr. Thompson at the bedside 

## 2012-03-15 NOTE — ED Provider Notes (Signed)
History     CSN: [id number]  Arrival date & time 03/15/12  0234   First MD Initiated Contact with Patient 03/15/12 0243      Chief Complaint  Patient presents with  . Abdominal Pain  . Fever  . Emesis    (Consider location/radiation/quality/duration/timing/severity/associated sxs/prior treatment) HPI Comments: 51 y/o female presents to the ED with her son who is a Sport and exercise psychologist with sudden onset abdominal pain that woke her up from sleep prior to arrival. States the pain began around her umbilicus moving down to her RLQ. Admits to associated nausea, vomiting, fever and chills. States she thought she had indigestion last night, took antacids, felt fine and went to bed. She still has her appendix. Had a normal appetite yesterday.  Patient is a 51 y.o. female presenting with abdominal pain, fever, and vomiting. The history is provided by the patient and a relative.  Abdominal Pain The primary symptoms of the illness include abdominal pain, fever and vomiting. The primary symptoms of the illness do not include shortness of breath or dysuria.  Additional symptoms associated with the illness include chills. Symptoms associated with the illness do not include back pain.  Fever Primary symptoms of the febrile illness include fever, abdominal pain and vomiting. Primary symptoms do not include shortness of breath, dysuria or rash.  Emesis  Associated symptoms include abdominal pain, chills and a fever.    Past Medical History  Diagnosis Date  . Thyroid disease   . Anxiety     Past Surgical History  Procedure Date  . Tubal ligation   . Dilation and curettage of uterus     No family history on file.  History  Substance Use Topics  . Smoking status: Never Smoker   . Smokeless tobacco: Not on file  . Alcohol Use: Yes    OB History    Grav Para Term Preterm Abortions TAB SAB Ect Mult Living                  Review of Systems  Constitutional: Positive for fever and chills.  Negative for appetite change.  HENT: Negative for neck pain.   Respiratory: Negative for shortness of breath.   Cardiovascular: Negative for chest pain.  Gastrointestinal: Positive for vomiting and abdominal pain.  Genitourinary: Negative for dysuria and pelvic pain.  Musculoskeletal: Negative for back pain.  Skin: Negative for rash.  Neurological: Negative for weakness.  Psychiatric/Behavioral: Negative for confusion.    Allergies  Review of patient's allergies indicates not on file.  Home Medications  No current outpatient prescriptions on file.  BP 102/63  Pulse 79  Temp 98.4 F (36.9 C) (Oral)  Resp 16  SpO2 100%  LMP 01/12/2012  Physical Exam  Nursing note and vitals reviewed. Constitutional: She is oriented to person, place, and time. Vital signs are normal. She appears well-developed and well-nourished. She appears distressed.  HENT:  Head: Normocephalic and atraumatic.  Mouth/Throat: Oropharynx is clear and moist and mucous membranes are normal.  Eyes: Conjunctivae normal and EOM are normal.  Neck: Normal range of motion. Neck supple.  Cardiovascular: Normal rate, regular rhythm and normal heart sounds.   Pulmonary/Chest: Effort normal and breath sounds normal. She has no decreased breath sounds. She has no wheezes.  Abdominal: Soft. Normal appearance and bowel sounds are normal. There is tenderness in the right lower quadrant and periumbilical area. There is guarding and tenderness at McBurney's point. There is no rigidity and no CVA tenderness.  Musculoskeletal: Normal range  of motion.  Neurological: She is alert and oriented to person, place, and time.  Skin: Skin is warm, dry and intact. She is not diaphoretic.  Psychiatric: Her speech is normal and behavior is normal. Her mood appears anxious.    ED Course  Procedures (including critical care time)   Labs Reviewed  COMPREHENSIVE METABOLIC PANEL  CBC WITH DIFFERENTIAL  URINALYSIS, ROUTINE W REFLEX  MICROSCOPIC   Results for orders placed during the hospital encounter of 03/15/12  COMPREHENSIVE METABOLIC PANEL      Component Value Range   Sodium 139  135 - 145 mEq/L   Potassium 4.0  3.5 - 5.1 mEq/L   Chloride 101  96 - 112 mEq/L   CO2 29  19 - 32 mEq/L   Glucose, Bld 124 (*) 70 - 99 mg/dL   BUN 17  6 - 23 mg/dL   Creatinine, Ser 0.98  0.50 - 1.10 mg/dL   Calcium 9.8  8.4 - 11.9 mg/dL   Total Protein 6.6  6.0 - 8.3 g/dL   Albumin 3.5  3.5 - 5.2 g/dL   AST 16  0 - 37 U/L   ALT 15  0 - 35 U/L   Alkaline Phosphatase 44  39 - 117 U/L   Total Bilirubin 0.2 (*) 0.3 - 1.2 mg/dL   GFR calc non Af Amer 71 (*) >90 mL/min   GFR calc Af Amer 82 (*) >90 mL/min  CBC WITH DIFFERENTIAL      Component Value Range   WBC 14.6 (*) 4.0 - 10.5 K/uL   RBC 3.93  3.87 - 5.11 MIL/uL   Hemoglobin 12.7  12.0 - 15.0 g/dL   HCT 14.7  82.9 - 56.2 %   MCV 92.9  78.0 - 100.0 fL   MCH 32.3  26.0 - 34.0 pg   MCHC 34.8  30.0 - 36.0 g/dL   RDW 13.0  86.5 - 78.4 %   Platelets 208  150 - 400 K/uL   Neutrophils Relative 81 (*) 43 - 77 %   Neutro Abs 11.8 (*) 1.7 - 7.7 K/uL   Lymphocytes Relative 13  12 - 46 %   Lymphs Abs 1.9  0.7 - 4.0 K/uL   Monocytes Relative 5  3 - 12 %   Monocytes Absolute 0.8  0.1 - 1.0 K/uL   Eosinophils Relative 1  0 - 5 %   Eosinophils Absolute 0.1  0.0 - 0.7 K/uL   Basophils Relative 0  0 - 1 %   Basophils Absolute 0.0  0.0 - 0.1 K/uL  URINALYSIS, ROUTINE W REFLEX MICROSCOPIC      Component Value Range   Color, Urine YELLOW  YELLOW   APPearance CLEAR  CLEAR   Specific Gravity, Urine 1.017  1.005 - 1.030   pH 5.0  5.0 - 8.0   Glucose, UA NEGATIVE  NEGATIVE mg/dL   Hgb urine dipstick NEGATIVE  NEGATIVE   Bilirubin Urine NEGATIVE  NEGATIVE   Ketones, ur NEGATIVE  NEGATIVE mg/dL   Protein, ur NEGATIVE  NEGATIVE mg/dL   Urobilinogen, UA 0.2  0.0 - 1.0 mg/dL   Nitrite NEGATIVE  NEGATIVE   Leukocytes, UA NEGATIVE  NEGATIVE    Ct Abdomen Pelvis W Contrast  03/15/2012   *RADIOLOGY REPORT*  Clinical Data: Periumbilical and right lower quadrant abdominal pain.  Nausea and vomiting.  Chills.  CT ABDOMEN AND PELVIS WITH CONTRAST  Technique:  Multidetector CT imaging of the abdomen and pelvis was performed following the standard protocol  during bolus administration of intravenous contrast.  Contrast: 1 OMNIPAQUE IOHEXOL 300 MG/ML  SOLN, OMNIPAQUE IOHEXOL 300 MG/ML  SOLN  Comparison: None.  Findings: Mild dependent atelectasis in the lung bases.  Circumscribed low density structure in the lateral segment left lobe of liver measuring approximately 9 mm.  This likely represents a cyst. The gallbladder, spleen, pancreas, adrenal glands, kidneys, abdominal aorta, and retroperitoneal lymph nodes are unremarkable. Small esophageal hiatal hernia.  Gastric wall is not thickened. Small bowel and colon are not abnormally distended.  No free air or free fluid in the abdomen.  Pelvis:  There is a distended fluid filled retrocecal appendix with maximal diameter of about 11 mm.  There is an appendicolith at the base of the appendix.  There is stranding in the fat around the appendix.  Changes are consistent with acute appendicitis.  No periappendiceal abscess.  Small amount of free fluid in the pelvis is likely reactive.  The uterus and ovaries are not enlarged.  The bladder is decompressed.  No significant pelvic lymphadenopathy.  No evidence of diverticulitis.  Normal alignment of the lumbar vertebrae with mild degenerative change.  IMPRESSION: Changes of acute appendicitis with appendicolith, fluid-filled distended appendix, and periappendiceal stranding.  No abscess. Small amount of free fluid in the pelvis is likely reactive.   Original Report Authenticated By: Marlon Pel, M.D.      1. Acute appendicitis       MDM  51 y/o female presenting with acute abdominal pain. CT showing appendicitis. White count elevated 14.6. Will consult surgery. Pain and nausea being controlled  with dilaudid and zofran.        Trevor Mace, PA-C 03/15/12 [id number]

## 2012-03-15 NOTE — ED Provider Notes (Signed)
5:24 AM d/w DR Dwain Sarna on call for [facility], will eval in ED, plan Cipro and flagyl for PCN and sulfa allergy.  Pain well controlled, NPO since 9pm, mod TTP RLQ. IVfs, IV narcotics prn. Labs and CT reviewed as below.   Medical screening examination/treatment/procedure(s) were conducted as a shared visit with non-physician practitioner(s) and myself.  I personally evaluated the patient during the encounter.  Results for orders placed during the hospital encounter of 03/15/12  COMPREHENSIVE METABOLIC PANEL      Component Value Range   Sodium 139  135 - 145 mEq/L   Potassium 4.0  3.5 - 5.1 mEq/L   Chloride 101  96 - 112 mEq/L   CO2 29  19 - 32 mEq/L   Glucose, Bld 124 (*) 70 - 99 mg/dL   BUN 17  6 - 23 mg/dL   Creatinine, Ser 1.91  0.50 - 1.10 mg/dL   Calcium 9.8  8.4 - 47.8 mg/dL   Total Protein 6.6  6.0 - 8.3 g/dL   Albumin 3.5  3.5 - 5.2 g/dL   AST 16  0 - 37 U/L   ALT 15  0 - 35 U/L   Alkaline Phosphatase 44  39 - 117 U/L   Total Bilirubin 0.2 (*) 0.3 - 1.2 mg/dL   GFR calc non Af Amer 71 (*) >90 mL/min   GFR calc Af Amer 82 (*) >90 mL/min  CBC WITH DIFFERENTIAL      Component Value Range   WBC 14.6 (*) 4.0 - 10.5 K/uL   RBC 3.93  3.87 - 5.11 MIL/uL   Hemoglobin 12.7  12.0 - 15.0 g/dL   HCT 29.5  62.1 - 30.8 %   MCV 92.9  78.0 - 100.0 fL   MCH 32.3  26.0 - 34.0 pg   MCHC 34.8  30.0 - 36.0 g/dL   RDW 65.7  84.6 - 96.2 %   Platelets 208  150 - 400 K/uL   Neutrophils Relative 81 (*) 43 - 77 %   Neutro Abs 11.8 (*) 1.7 - 7.7 K/uL   Lymphocytes Relative 13  12 - 46 %   Lymphs Abs 1.9  0.7 - 4.0 K/uL   Monocytes Relative 5  3 - 12 %   Monocytes Absolute 0.8  0.1 - 1.0 K/uL   Eosinophils Relative 1  0 - 5 %   Eosinophils Absolute 0.1  0.0 - 0.7 K/uL   Basophils Relative 0  0 - 1 %   Basophils Absolute 0.0  0.0 - 0.1 K/uL  URINALYSIS, ROUTINE W REFLEX MICROSCOPIC      Component Value Range   Color, Urine YELLOW  YELLOW   APPearance CLEAR  CLEAR   Specific Gravity, Urine 1.017   1.005 - 1.030   pH 5.0  5.0 - 8.0   Glucose, UA NEGATIVE  NEGATIVE mg/dL   Hgb urine dipstick NEGATIVE  NEGATIVE   Bilirubin Urine NEGATIVE  NEGATIVE   Ketones, ur NEGATIVE  NEGATIVE mg/dL   Protein, ur NEGATIVE  NEGATIVE mg/dL   Urobilinogen, UA 0.2  0.0 - 1.0 mg/dL   Nitrite NEGATIVE  NEGATIVE   Leukocytes, UA NEGATIVE  NEGATIVE   Ct Abdomen Pelvis W Contrast  03/15/2012  *RADIOLOGY REPORT*  Clinical Data: Periumbilical and right lower quadrant abdominal pain.  Nausea and vomiting.  Chills.  CT ABDOMEN AND PELVIS WITH CONTRAST  Technique:  Multidetector CT imaging of the abdomen and pelvis was performed following the standard protocol during bolus administration of intravenous  contrast.  Contrast: 1 OMNIPAQUE IOHEXOL 300 MG/ML  SOLN, OMNIPAQUE IOHEXOL 300 MG/ML  SOLN  Comparison: None.  Findings: Mild dependent atelectasis in the lung bases.  Circumscribed low density structure in the lateral segment left lobe of liver measuring approximately 9 mm.  This likely represents a cyst. The gallbladder, spleen, pancreas, adrenal glands, kidneys, abdominal aorta, and retroperitoneal lymph nodes are unremarkable. Small esophageal hiatal hernia.  Gastric wall is not thickened. Small bowel and colon are not abnormally distended.  No free air or free fluid in the abdomen.  Pelvis:  There is a distended fluid filled retrocecal appendix with maximal diameter of about 11 mm.  There is an appendicolith at the base of the appendix.  There is stranding in the fat around the appendix.  Changes are consistent with acute appendicitis.  No periappendiceal abscess.  Small amount of free fluid in the pelvis is likely reactive.  The uterus and ovaries are not enlarged.  The bladder is decompressed.  No significant pelvic lymphadenopathy.  No evidence of diverticulitis.  Normal alignment of the lumbar vertebrae with mild degenerative change.  IMPRESSION: Changes of acute appendicitis with appendicolith, fluid-filled  distended appendix, and periappendiceal stranding.  No abscess. Small amount of free fluid in the pelvis is likely reactive.   Original Report Authenticated By: Marlon Pel, M.D.      Sunnie Nielsen, MD 03/15/12 [phone]

## 2012-03-15 NOTE — ED Notes (Addendum)
C/o abd pain (periumbilical to RLQ) , also nv, chills. (Denies: bleeding, known fever, vaginal or urinary sx, back pain, diarrhea or other sx). Last ate 2100. Last BM yesterday (normal). immodium at 0100. "concern for appendicitis". Medical family member reports +rebound tenderness.

## 2012-03-15 NOTE — ED Notes (Signed)
Pt finished contrast. Morrie Sheldon in CT notified.

## 2012-03-15 NOTE — Preoperative (Signed)
Beta Blockers   Reason not to administer Beta Blockers:Not Applicable 

## 2012-03-16 ENCOUNTER — Encounter (HOSPITAL_COMMUNITY): Payer: Self-pay

## 2012-03-16 ENCOUNTER — Encounter (HOSPITAL_COMMUNITY): Payer: Self-pay | Admitting: *Deleted

## 2012-03-16 DIAGNOSIS — K3580 Unspecified acute appendicitis: Secondary | ICD-10-CM

## 2012-03-16 DIAGNOSIS — E079 Disorder of thyroid, unspecified: Secondary | ICD-10-CM

## 2012-03-16 HISTORY — DX: Disorder of thyroid, unspecified: E07.9

## 2012-03-16 MED ORDER — OXYCODONE-ACETAMINOPHEN 5-325 MG PO TABS: 1.0000 | ORAL_TABLET | ORAL | Status: DC | PRN

## 2012-03-16 MED ORDER — IBUPROFEN 200 MG PO TABS: ORAL_TABLET | ORAL | Status: AC

## 2012-03-16 MED ORDER — ACETAMINOPHEN 325 MG PO TABS: 650.0000 mg | ORAL_TABLET | ORAL | Status: AC | PRN

## 2012-03-16 NOTE — Discharge Summary (Signed)
Physician Discharge Summary  Patient ID: Vicki Abbott MRN: [id number] DOB/AGE: 1960/06/23 51 y.o.  Admit date: 03/15/2012 Discharge date: 03/16/2012  Admission Diagnoses: Appendicitis  Discharge Diagnoses: Same Principal Problem:  *Appendicitis, acute Active Problems:  Thyroid disorder   PROCEDURES: APPENDECTOMY LAPAROSCOPIC 03/15/2012, [name], MD  Hospital Course: 18 yof who began having abdominal pain last night now worsening and localized to rlq. She did not have a fever. Did have nausea and some emesis. She has prior history of normal colonoscopy. Pain made worse with palpation and movement.  She was admitted in early AM and underwent surgery that day.  She did well post op and was discharged the following day on regular diet. Follow up in 2 weeks. Condition on D/C:  Improved         Disposition: Home     Medication List     As of 03/16/2012  9:17 AM    TAKE these medications         acetaminophen 325 MG tablet   Commonly known as: TYLENOL   Take 2 tablets (650 mg total) by mouth every 4 (four) hours as needed.      buPROPion 300 MG 24 hr tablet   Commonly known as: WELLBUTRIN XL   Take 300 mg by mouth daily.      calcium carbonate 500 MG chewable tablet   Commonly known as: TUMS - dosed in mg elemental calcium   Chew 2 tablets by mouth 2 (two) times daily as needed. For stomach pain      estrogens (conjugated) 1.25 MG tablet   Commonly known as: PREMARIN   Take 1.25 mg by mouth daily.      ibuprofen 200 MG tablet   Commonly known as: ADVIL,MOTRIN   You can take 2-3 tablets every 6 hours as needed for pain.      levothyroxine 112 MCG tablet   Commonly known as: SYNTHROID, LEVOTHROID   Take 112 mcg by mouth daily.      loperamide 2 MG capsule   Commonly known as: IMODIUM   Take 2 mg by mouth 4 (four) times daily as needed. For stomach pain      loratadine 10 MG tablet   Commonly known as: CLARITIN   Take 10 mg by mouth daily as  needed. For allergy symptoms      oxyCODONE-acetaminophen 5-325 MG per tablet   Commonly known as: PERCOCET/ROXICET   Take 1-2 tablets by mouth every 4 (four) hours as needed.        Follow-up Information    Follow up with [facility] E, MD. Schedule an appointment as soon as possible for a visit in 2 weeks. (Call and ask for appointment 2-3 weeks in the [facility] clinic or DR. Janee Morn.)    Contact information:   [address] [place] [address] [phone]          Signed: Sherrie George 03/16/2012, 9:17 AM

## 2012-03-16 NOTE — Progress Notes (Signed)
1 Day Post-Op  Subjective: Sore but otherwise doing well.  Objective: Vital signs in last 24 hours: Temp:  [97.7 F (36.5 C)-98.5 F (36.9 C)] 97.7 F (36.5 C) (10/15 0815) Pulse Rate:  [70-86] 80  (10/15 0815) Resp:  [12-25] 20  (10/15 0815) BP: (95-117)/(53-71) 117/71 mmHg (10/15 0815) SpO2:  [93 %-99 %] 98 % (10/15 0815) Last BM Date: 03/15/12 Diet: regular,  VSS,  Intake/Output from previous day: 10/14 0701 - 10/15 0700 In: 2425 [I.V.:2425] Out: 1100 [Urine:1100] Intake/Output this shift:    General appearance: alert, cooperative and no distress GI: soft, sore incisions look good.  Lab Results:   Basename 03/15/12 1435 03/15/12 0242  WBC 11.6* 14.6*  HGB 11.8* 12.7  HCT 34.4* 36.5  PLT 165 208    BMET  Basename 03/15/12 1435 03/15/12 0242  NA -- 139  K -- 4.0  CL -- 101  CO2 -- 29  GLUCOSE -- 124*  BUN -- 17  CREATININE 0.81 0.91  CALCIUM -- 9.8   PT/INR No results found for this basename: LABPROT:2,INR:2 in the last 72 hours   Lab 03/15/12 0242  AST 16  ALT 15  ALKPHOS 44  BILITOT 0.2*  PROT 6.6  ALBUMIN 3.5     Lipase  No results found for this basename: lipase     Studies/Results: Ct Abdomen Pelvis W Contrast  03/15/2012  *RADIOLOGY REPORT*  Clinical Data: Periumbilical and right lower quadrant abdominal pain.  Nausea and vomiting.  Chills.  CT ABDOMEN AND PELVIS WITH CONTRAST  Technique:  Multidetector CT imaging of the abdomen and pelvis was performed following the standard protocol during bolus administration of intravenous contrast.  Contrast: 1 OMNIPAQUE IOHEXOL 300 MG/ML  SOLN, OMNIPAQUE IOHEXOL 300 MG/ML  SOLN  Comparison: None.  Findings: Mild dependent atelectasis in the lung bases.  Circumscribed low density structure in the lateral segment left lobe of liver measuring approximately 9 mm.  This likely represents a cyst. The gallbladder, spleen, pancreas, adrenal glands, kidneys, abdominal aorta, and retroperitoneal lymph nodes  are unremarkable. Small esophageal hiatal hernia.  Gastric wall is not thickened. Small bowel and colon are not abnormally distended.  No free air or free fluid in the abdomen.  Pelvis:  There is a distended fluid filled retrocecal appendix with maximal diameter of about 11 mm.  There is an appendicolith at the base of the appendix.  There is stranding in the fat around the appendix.  Changes are consistent with acute appendicitis.  No periappendiceal abscess.  Small amount of free fluid in the pelvis is likely reactive.  The uterus and ovaries are not enlarged.  The bladder is decompressed.  No significant pelvic lymphadenopathy.  No evidence of diverticulitis.  Normal alignment of the lumbar vertebrae with mild degenerative change.  IMPRESSION: Changes of acute appendicitis with appendicolith, fluid-filled distended appendix, and periappendiceal stranding.  No abscess. Small amount of free fluid in the pelvis is likely reactive.   Original Report Authenticated By: Marlon Pel, M.D.     Medications:    . buPROPion  300 mg Oral Daily  . estrogens (conjugated)  1.25 mg Oral Daily  . heparin  5,000 Units Subcutaneous Q8H  . HYDROmorphone      . levothyroxine  112 mcg Oral Daily  . DISCONTD: iohexol  20 mL Oral Q1 Hr x 2    Assessment/Plan Appendicitis s/p lap appy 03/15/12 Thyroid dz Anxiety   Plan:  Home today follow up in 2 weeks.   LOS:  1 day    Oline Belk 03/16/2012

## 2012-03-16 NOTE — Progress Notes (Signed)
Agree Gabriel Paulding, MD, MPH, FACS Pager: [phone]  

## 2012-03-16 NOTE — Discharge Summary (Signed)
Ardenia Stiner, MD, MPH, FACS Pager: [phone]  

## 2012-03-16 NOTE — Progress Notes (Signed)
Discharge home.

## 2012-03-17 ENCOUNTER — Encounter (HOSPITAL_COMMUNITY): Payer: Self-pay | Admitting: General Surgery

## 2012-04-05 ENCOUNTER — Ambulatory Visit (INDEPENDENT_AMBULATORY_CARE_PROVIDER_SITE_OTHER): Payer: BC Managed Care – PPO | Admitting: General Surgery

## 2012-04-05 ENCOUNTER — Encounter (INDEPENDENT_AMBULATORY_CARE_PROVIDER_SITE_OTHER): Payer: Self-pay | Admitting: General Surgery

## 2012-04-05 VITALS — BP 108/60 | HR 64 | Temp 98.0°F | Resp 18 | Ht 68.0 in | Wt 136.0 lb

## 2012-04-05 DIAGNOSIS — K3580 Unspecified acute appendicitis: Secondary | ICD-10-CM

## 2012-04-05 NOTE — Progress Notes (Signed)
Patient ID: Vicki Abbott, female   DOB: [birth year], 51 y.o.   MRN: [id number] This patient underwent laparoscopic appendectomy for uncomplicated, histologically confirmed acute appendicitis by Dr. Violeta Gelinas on 03/16/2011. She has done well. 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 No complaints.  On exam she looks well. Abdomen is soft. Nontender. All trocar sites well healed  Plan: Resume normal activities. No restriction. Return when necessary  [facility]. Derrell Lolling, M.D., [facility] General and Minimally invasive Surgery Breast and Colorectal Surgery Office:   [phone] Pager:   [phone]

## 2012-04-05 NOTE — Patient Instructions (Signed)
You have recovered from your appendectomy without any obvious surgical complications.  You may resume normal activities, no restrictions. Be sure to stretch well before and after exercise.  Return to see Dr. Violeta Gelinas if any further problems arise.

## 2012-08-12 ENCOUNTER — Other Ambulatory Visit: Payer: Self-pay | Admitting: Obstetrics & Gynecology

## 2013-07-03 HISTORY — PX: TOTAL ABDOMINAL HYSTERECTOMY: SHX209

## 2013-07-12 ENCOUNTER — Other Ambulatory Visit: Payer: Self-pay | Admitting: Obstetrics & Gynecology

## 2013-10-17 DIAGNOSIS — M6740 Ganglion, unspecified site: Secondary | ICD-10-CM | POA: Insufficient documentation

## 2013-10-28 ENCOUNTER — Other Ambulatory Visit: Payer: Self-pay | Admitting: Orthopedic Surgery

## 2013-11-22 ENCOUNTER — Encounter (HOSPITAL_BASED_OUTPATIENT_CLINIC_OR_DEPARTMENT_OTHER): Payer: Self-pay | Admitting: *Deleted

## 2013-11-22 NOTE — Progress Notes (Signed)
No labs needed

## 2013-11-25 ENCOUNTER — Ambulatory Visit (HOSPITAL_BASED_OUTPATIENT_CLINIC_OR_DEPARTMENT_OTHER)
Admission: RE | Admit: 2013-11-25 | Discharge: 2013-11-25 | Disposition: A | Discharge status: Home / Self Care | Payer: BC Managed Care – PPO | Source: Ambulatory Visit | Attending: Orthopedic Surgery | Admitting: Orthopedic Surgery

## 2013-11-25 ENCOUNTER — Encounter (HOSPITAL_BASED_OUTPATIENT_CLINIC_OR_DEPARTMENT_OTHER)
Admission: RE | Disposition: A | Discharge status: Home / Self Care | Payer: Self-pay | Source: Ambulatory Visit | Attending: Orthopedic Surgery

## 2013-11-25 ENCOUNTER — Ambulatory Visit (HOSPITAL_BASED_OUTPATIENT_CLINIC_OR_DEPARTMENT_OTHER): Payer: BC Managed Care – PPO | Admitting: Anesthesiology

## 2013-11-25 ENCOUNTER — Encounter (HOSPITAL_BASED_OUTPATIENT_CLINIC_OR_DEPARTMENT_OTHER): Payer: Self-pay | Admitting: Anesthesiology

## 2013-11-25 ENCOUNTER — Encounter (HOSPITAL_BASED_OUTPATIENT_CLINIC_OR_DEPARTMENT_OTHER): Payer: BC Managed Care – PPO | Admitting: Anesthesiology

## 2013-11-25 DIAGNOSIS — F411 Generalized anxiety disorder: Secondary | ICD-10-CM | POA: Insufficient documentation

## 2013-11-25 DIAGNOSIS — D2110 Benign neoplasm of connective and other soft tissue of unspecified upper limb, including shoulder: Secondary | ICD-10-CM | POA: Insufficient documentation

## 2013-11-25 DIAGNOSIS — E079 Disorder of thyroid, unspecified: Secondary | ICD-10-CM | POA: Insufficient documentation

## 2013-11-25 HISTORY — PX: MASS EXCISION: SHX2000

## 2013-11-25 HISTORY — DX: Other specified postprocedural states: Z98.890

## 2013-11-25 HISTORY — DX: Other specified postprocedural states: R11.2

## 2013-11-25 LAB — POCT HEMOGLOBIN-HEMACUE: HEMOGLOBIN: 11.1 g/dL — AB (ref 12.0–15.0)

## 2013-11-25 SURGERY — EXCISION MASS
Anesthesia: General | Site: Elbow | Laterality: Right

## 2013-11-25 MED ORDER — FENTANYL CITRATE 0.05 MG/ML IJ SOLN: 50.0000 ug | INTRAMUSCULAR | Status: DC | PRN

## 2013-11-25 MED ORDER — VANCOMYCIN HCL IN DEXTROSE 1-5 GM/200ML-% IV SOLN
1000.0000 mg | INTRAVENOUS | Status: AC
  Administered 2013-11-25: 1000 mg via INTRAVENOUS

## 2013-11-25 MED ORDER — FENTANYL CITRATE 0.05 MG/ML IJ SOLN
INTRAMUSCULAR | Status: AC
  Filled 2013-11-25: qty 6

## 2013-11-25 MED ORDER — CHLORHEXIDINE GLUCONATE 4 % EX LIQD: 60.0000 mL | Freq: Once | CUTANEOUS | Status: DC

## 2013-11-25 MED ORDER — LACTATED RINGERS IV SOLN
INTRAVENOUS | Status: DC
  Administered 2013-11-25: 12:00:00 via INTRAVENOUS

## 2013-11-25 MED ORDER — PROPOFOL 10 MG/ML IV BOLUS
INTRAVENOUS | Status: DC | PRN
  Administered 2013-11-25: 200 mg via INTRAVENOUS

## 2013-11-25 MED ORDER — OXYCODONE HCL 5 MG/5ML PO SOLN: 5.0000 mg | Freq: Once | ORAL | Status: DC | PRN

## 2013-11-25 MED ORDER — VANCOMYCIN HCL IN DEXTROSE 1-5 GM/200ML-% IV SOLN
INTRAVENOUS | Status: AC
  Filled 2013-11-25: qty 200

## 2013-11-25 MED ORDER — MIDAZOLAM HCL 2 MG/2ML IJ SOLN
INTRAMUSCULAR | Status: AC
  Filled 2013-11-25: qty 2

## 2013-11-25 MED ORDER — VANCOMYCIN HCL IN DEXTROSE 1-5 GM/200ML-% IV SOLN: 1000.0000 mg | INTRAVENOUS | Status: DC

## 2013-11-25 MED ORDER — OXYCODONE HCL 5 MG PO TABS: 5.0000 mg | ORAL_TABLET | Freq: Once | ORAL | Status: DC | PRN

## 2013-11-25 MED ORDER — HYDROMORPHONE HCL PF 1 MG/ML IJ SOLN: 0.2500 mg | INTRAMUSCULAR | Status: DC | PRN

## 2013-11-25 MED ORDER — PROMETHAZINE HCL 6.25 MG/5ML PO SYRP: 12.5000 mg | ORAL_SOLUTION | Freq: Four times a day (QID) | ORAL | Status: DC | PRN

## 2013-11-25 MED ORDER — BUPIVACAINE HCL (PF) 0.25 % IJ SOLN
INTRAMUSCULAR | Status: DC | PRN
  Administered 2013-11-25: 4.5 mL

## 2013-11-25 MED ORDER — PROMETHAZINE HCL 25 MG/ML IJ SOLN: 6.2500 mg | INTRAMUSCULAR | Status: DC | PRN

## 2013-11-25 MED ORDER — ONDANSETRON HCL 4 MG/2ML IJ SOLN
INTRAMUSCULAR | Status: DC | PRN
  Administered 2013-11-25: 4 mg via INTRAVENOUS

## 2013-11-25 MED ORDER — HYDROCODONE-ACETAMINOPHEN 5-325 MG PO TABS: 1.0000 | ORAL_TABLET | Freq: Four times a day (QID) | ORAL | Status: DC | PRN

## 2013-11-25 MED ORDER — MIDAZOLAM HCL 2 MG/2ML IJ SOLN: 1.0000 mg | INTRAMUSCULAR | Status: DC | PRN

## 2013-11-25 MED ORDER — BUPIVACAINE HCL (PF) 0.25 % IJ SOLN
INTRAMUSCULAR | Status: AC
  Filled 2013-11-25: qty 30

## 2013-11-25 MED ORDER — KETOROLAC TROMETHAMINE 30 MG/ML IJ SOLN
INTRAMUSCULAR | Status: DC | PRN
  Administered 2013-11-25: 30 mg via INTRAVENOUS

## 2013-11-25 MED ORDER — LIDOCAINE HCL (CARDIAC) 20 MG/ML IV SOLN
INTRAVENOUS | Status: DC | PRN
  Administered 2013-11-25 (×2): 60 mg via INTRAVENOUS

## 2013-11-25 MED ORDER — MIDAZOLAM HCL 5 MG/5ML IJ SOLN
INTRAMUSCULAR | Status: DC | PRN
  Administered 2013-11-25: 2 mg via INTRAVENOUS

## 2013-11-25 MED ORDER — DEXAMETHASONE SODIUM PHOSPHATE 4 MG/ML IJ SOLN
INTRAMUSCULAR | Status: DC | PRN
  Administered 2013-11-25: 10 mg via INTRAVENOUS

## 2013-11-25 MED ORDER — FENTANYL CITRATE 0.05 MG/ML IJ SOLN
INTRAMUSCULAR | Status: DC | PRN
  Administered 2013-11-25: 50 ug via INTRAVENOUS

## 2013-11-25 SURGICAL SUPPLY — 52 items
BANDAGE COBAN STERILE 2 (GAUZE/BANDAGES/DRESSINGS) IMPLANT
BLADE MINI RND TIP GREEN BEAV (BLADE) IMPLANT
BLADE SURG 15 STRL LF DISP TIS (BLADE) ×1 IMPLANT
BLADE SURG 15 STRL SS (BLADE) ×3
BNDG CMPR 9X4 STRL LF SNTH (GAUZE/BANDAGES/DRESSINGS) ×1
BNDG COHESIVE 1X5 TAN STRL LF (GAUZE/BANDAGES/DRESSINGS) IMPLANT
BNDG COHESIVE 3X5 TAN STRL LF (GAUZE/BANDAGES/DRESSINGS) ×2 IMPLANT
BNDG ESMARK 4X9 LF (GAUZE/BANDAGES/DRESSINGS) ×2 IMPLANT
BNDG GAUZE ELAST 4 BULKY (GAUZE/BANDAGES/DRESSINGS) IMPLANT
CHLORAPREP W/TINT 26ML (MISCELLANEOUS) ×3 IMPLANT
CORDS BIPOLAR (ELECTRODE) ×3 IMPLANT
COVER MAYO STAND STRL (DRAPES) ×3 IMPLANT
COVER TABLE BACK 60X90 (DRAPES) ×3 IMPLANT
CUFF TOURNIQUET SINGLE 18IN (TOURNIQUET CUFF) ×2 IMPLANT
DECANTER SPIKE VIAL GLASS SM (MISCELLANEOUS) IMPLANT
DRAIN PENROSE 1/2X12 LTX STRL (WOUND CARE) IMPLANT
DRAPE EXTREMITY T 121X128X90 (DRAPE) ×3 IMPLANT
DRAPE SURG 17X23 STRL (DRAPES) ×3 IMPLANT
GAUZE SPONGE 4X4 12PLY STRL (GAUZE/BANDAGES/DRESSINGS) ×3 IMPLANT
GAUZE XEROFORM 1X8 LF (GAUZE/BANDAGES/DRESSINGS) ×3 IMPLANT
GLOVE BIOGEL M 7.0 STRL (GLOVE) ×4 IMPLANT
GLOVE BIOGEL PI IND STRL 7.5 (GLOVE) IMPLANT
GLOVE BIOGEL PI IND STRL 8.5 (GLOVE) ×1 IMPLANT
GLOVE BIOGEL PI INDICATOR 7.5 (GLOVE) ×2
GLOVE BIOGEL PI INDICATOR 8.5 (GLOVE) ×2
GLOVE SURG ORTHO 8.0 STRL STRW (GLOVE) ×3 IMPLANT
GOWN STRL REUS W/ TWL LRG LVL3 (GOWN DISPOSABLE) ×1 IMPLANT
GOWN STRL REUS W/TWL LRG LVL3 (GOWN DISPOSABLE) ×3
GOWN STRL REUS W/TWL XL LVL3 (GOWN DISPOSABLE) ×3 IMPLANT
NDL SAFETY ECLIPSE 18X1.5 (NEEDLE) IMPLANT
NEEDLE 27GAX1X1/2 (NEEDLE) ×2 IMPLANT
NEEDLE HYPO 18GX1.5 SHARP (NEEDLE)
NS IRRIG 1000ML POUR BTL (IV SOLUTION) ×3 IMPLANT
PACK BASIN DAY SURGERY FS (CUSTOM PROCEDURE TRAY) ×3 IMPLANT
PAD CAST 3X4 CTTN HI CHSV (CAST SUPPLIES) IMPLANT
PADDING CAST ABS 3INX4YD NS (CAST SUPPLIES)
PADDING CAST ABS 4INX4YD NS (CAST SUPPLIES)
PADDING CAST ABS COTTON 3X4 (CAST SUPPLIES) IMPLANT
PADDING CAST ABS COTTON 4X4 ST (CAST SUPPLIES) ×1 IMPLANT
PADDING CAST COTTON 3X4 STRL (CAST SUPPLIES)
SPLINT PLASTER CAST XFAST 3X15 (CAST SUPPLIES) IMPLANT
SPLINT PLASTER XTRA FASTSET 3X (CAST SUPPLIES)
STOCKINETTE 4X48 STRL (DRAPES) ×3 IMPLANT
SUT VIC AB 4-0 P2 18 (SUTURE) IMPLANT
SUT VICRYL 4-0 PS2 18IN ABS (SUTURE) ×2 IMPLANT
SUT VICRYL AB 3 0 TIES (SUTURE) ×2 IMPLANT
SUT VICRYL RAPID 5 0 P 3 (SUTURE) IMPLANT
SUT VICRYL RAPIDE 4/0 PS 2 (SUTURE) ×3 IMPLANT
SYR BULB 3OZ (MISCELLANEOUS) ×3 IMPLANT
SYR CONTROL 10ML LL (SYRINGE) ×2 IMPLANT
TOWEL OR 17X24 6PK STRL BLUE (TOWEL DISPOSABLE) ×3 IMPLANT
UNDERPAD 30X30 INCONTINENT (UNDERPADS AND DIAPERS) ×3 IMPLANT

## 2013-11-25 NOTE — Anesthesia Postprocedure Evaluation (Signed)
  Anesthesia Post-op Note  Patient: Vicki Abbott  Procedure(s) Performed: Procedure(s) with comments: BIOPSY MASS RIGHT ELBOW (Right) - excision of mass antecubital fossa right elbow  Patient Location: PACU  Anesthesia Type:General  Level of Consciousness: awake and alert   Airway and Oxygen Therapy: Patient Spontanous Breathing  Post-op Pain: none  Post-op Assessment: Post-op Vital signs reviewed  Post-op Vital Signs: stable  Last Vitals:  Filed Vitals:   11/25/13 1345  BP: 108/69  Pulse: 70  Temp:   Resp: 12    Complications: No apparent anesthesia complications

## 2013-11-25 NOTE — H&P (Signed)
Ms. Vicki Abbott is a 53 year-old right-hand dominant female with a mass in her antecubital fossa right elbow. This is the connection between the basilic and cephalic vein, felt to be very soft. She recalls no history of trauma, no blood work has been done.  She has history of thyroid problems. She is not complaining of any pain.  She states it has not significantly changed other than getting slightly larger.  She has had her ultrasound done and this is not a varicosity. It has blood vessels within it. This is a well circumscribed soft tissue tumor. We spoke with the radiologist with respect to this. There appears to be blood vessels on the inside. 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 No diagnosis is given. It is well circumscribed.    ALLERGIES:    Penicillin and sulfa.  MEDICATIONS:     Bupropion, levothyroxine and Premarin.  SURGICAL HISTORY:     Hysterectomy.  FAMILY MEDICAL HISTORY:     Positive for diabetes, high blood pressure.   SOCIAL HISTORY:      She does not smoke, drinks socially.  She is married and retired.   REVIEW OF SYSTEMS:   Positive for glasses, negative 14 points otherwise. Vicki Abbott is an 53 y.o. female.   Chief Complaint: mass right elbow HPI: see above  Past Medical History  Diagnosis Date  . Thyroid disease   . Anxiety   . No pertinent past medical history   . Appendicitis, acute 03/16/2012  . Thyroid disorder 03/16/2012  . PONV (postoperative nausea and vomiting)   . Wears glasses     Past Surgical History  Procedure Laterality Date  . Tubal ligation    . Dilation and curettage of uterus    . Laparoscopic appendectomy  03/15/2012    Procedure: APPENDECTOMY LAPAROSCOPIC;  Surgeon: Zenovia Jarred, MD;  Location: [facility];  Service: General;  Laterality: N/A;  . Tonsillectomy    . Total abdominal hysterectomy  2/15  . Colonoscopy       History reviewed. No pertinent family history. Social History:  reports that she has never smoked. She does not have any smokeless tobacco history on file. She reports that she drinks alcohol. She reports that she does not use illicit drugs.  Allergies:  Allergies  Allergen Reactions  . Penicillins Rash  . Sulfa Antibiotics Rash    Medications Prior to Admission  Medication Sig Dispense Refill  . acetaminophen (TYLENOL) 325 MG tablet Take 2 tablets (650 mg total) by mouth every 4 (four) hours as needed.      Marland Kitchen buPROPion (WELLBUTRIN XL) 300 MG 24 hr tablet Take 300 mg by mouth daily.      Marland Kitchen estrogens, conjugated, (PREMARIN) 1.25 MG tablet Take 1.25 mg by mouth daily.      Marland Kitchen ibuprofen (ADVIL) 200 MG tablet You can take 2-3 tablets every 6 hours as needed for pain.  30 tablet  0  . levothyroxine (SYNTHROID, LEVOTHROID) 112 MCG tablet Take 112 mcg by mouth daily.      Marland Kitchen loperamide (IMODIUM) 2 MG capsule Take 2 mg by mouth 4 (four) times daily as needed. For stomach pain      . loratadine (CLARITIN) 10 MG tablet Take 10 mg by mouth daily as needed. For allergy symptoms      . calcium carbonate (TUMS - DOSED IN MG ELEMENTAL CALCIUM)  500 MG chewable tablet Chew 2 tablets by mouth 2 (two) times daily as needed. For stomach pain        No results found for this or any previous visit (from the past 48 hour(s)).  No results found.   Pertinent items are noted in HPI.  Blood pressure 109/75, pulse 85, temperature 98 F (36.7 C), temperature source Oral, resp. rate 20, height 5\' 8"  (1.727 m), weight 59.058 kg (130 lb 3.2 oz), last menstrual period 01/12/2012, SpO2 98.00%.  General appearance: alert, cooperative and appears stated age Head: Normocephalic, without obvious abnormality Neck: no JVD Resp: normal percussion bilaterally clear to auscultation Cardio: regular rate and rhythm, S1, S2 normal, no murmur, click, rub or gallop GI: soft, non-tender; bowel sounds normal; no masses,  no  organomegaly Extremities: mass right elbow Pulses: 2+ and symmetric Skin: Skin color, texture, turgor normal. No rashes or lesions Neurologic: Grossly normal Incision/Wound: na  Assessment/Plan  She is scheduled for excisional biopsy mass right elbow.  Parthenia Tellefsen R 11/25/2013, 11:44 AM

## 2013-11-25 NOTE — Transfer of Care (Signed)
Immediate Anesthesia Transfer of Care Note  Patient: Vicki Abbott  Procedure(s) Performed: Procedure(s) with comments: BIOPSY MASS RIGHT ELBOW (Right) - excision of mass antecubital fossa right elbow  Patient Location: PACU  Anesthesia Type:General  Level of Consciousness: awake and sedated  Airway & Oxygen Therapy: Patient Spontanous Breathing and Patient connected to face mask oxygen  Post-op Assessment: Report given to PACU RN and Post -op Vital signs reviewed and stable  Post vital signs: Reviewed and stable  Complications: No apparent anesthesia complications

## 2013-11-25 NOTE — Discharge Instructions (Addendum)

## 2013-11-25 NOTE — Anesthesia Procedure Notes (Signed)
Procedure Name: LMA Insertion Performed by: PEARSON, DONNA W Pre-anesthesia Checklist: Patient identified, Timeout performed, Emergency Drugs available, Suction available and Patient being monitored Patient Re-evaluated:Patient Re-evaluated prior to inductionOxygen Delivery Method: Circle system utilized Preoxygenation: Pre-oxygenation with 100% oxygen Intubation Type: IV induction Ventilation: Mask ventilation without difficulty LMA: LMA inserted LMA Size: 4.0 Number of attempts: 1 Placement Confirmation: positive ETCO2 Tube secured with: Tape Dental Injury: Teeth and Oropharynx as per pre-operative assessment      

## 2013-11-25 NOTE — Brief Op Note (Signed)
11/25/2013  1:13 PM  PATIENT:  Enzo Bi  53 y.o. female  PRE-OPERATIVE DIAGNOSIS:  MASS ANTECUBITAL FOSSA RIGHT ELBOW  POST-OPERATIVE DIAGNOSIS:  MASS ANTECUBITAL FOSSA RIGHT ELBOW  PROCEDURE:  Procedure(s) with comments: BIOPSY MASS RIGHT ELBOW (Right) - excision of mass antecubital fossa right elbow  SURGEON:  Surgeon(s) and Role:    * Wynonia Sours, MD - Primary  PHYSICIAN ASSISTANT:   ASSISTANTS: none   ANESTHESIA:   local and general  EBL:  Total I/O In: 900 [I.V.:900] Out: -   BLOOD ADMINISTERED:none  DRAINS: none   LOCAL MEDICATIONS USED:  BUPIVICAINE   SPECIMEN:  Excision  DISPOSITION OF SPECIMEN:  PATHOLOGY  COUNTS:  YES  TOURNIQUET:   Total Tourniquet Time Documented: Upper Arm (Right) - 17 minutes Total: Upper Arm (Right) - 17 minutes   DICTATION: .Other Dictation: Dictation Number [id number]  PLAN OF CARE: Discharge to home after PACU  PATIENT DISPOSITION:  PACU - hemodynamically stable.

## 2013-11-25 NOTE — Op Note (Signed)
Dictation Number [id number]

## 2013-11-25 NOTE — Anesthesia Preprocedure Evaluation (Signed)
Anesthesia Evaluation  Patient identified by MRN, date of birth, ID band Patient awake    Reviewed: Allergy & Precautions, H&P , NPO status , Patient's Chart, lab work & pertinent test results  History of Anesthesia Complications (+) PONV  Airway Mallampati: I  Neck ROM: Full    Dental  (+) Teeth Intact   Pulmonary neg pulmonary ROS,          Cardiovascular negative cardio ROS  Rhythm:Regular Rate:Normal     Neuro/Psych negative neurological ROS     GI/Hepatic negative GI ROS, Neg liver ROS,   Endo/Other  negative endocrine ROS  Renal/GU negative Renal ROS     Musculoskeletal   Abdominal   Peds  Hematology negative hematology ROS (+)   Anesthesia Other Findings   Reproductive/Obstetrics                           Anesthesia Physical Anesthesia Plan  ASA: I  Anesthesia Plan: General   Post-op Pain Management:    Induction: Intravenous  Airway Management Planned: LMA  Additional Equipment:   Intra-op Plan:   Post-operative Plan: Extubation in OR  Informed Consent: I have reviewed the patients History and Physical, chart, labs and discussed the procedure including the risks, benefits and alternatives for the proposed anesthesia with the patient or authorized representative who has indicated his/her understanding and acceptance.   Dental advisory given  Plan Discussed with: CRNA and Surgeon  Anesthesia Plan Comments:         Anesthesia Quick Evaluation

## 2013-11-26 NOTE — Op Note (Signed)
Vicki Abbott, Vicki Abbott            ACCOUNT NO.:  [id number]  MEDICAL RECORD NO.:  [id number]  LOCATION:                                 FACILITY:  PHYSICIAN:  Daryll Brod, M.D.            DATE OF BIRTH:  DATE OF PROCEDURE:  11/25/2013 DATE OF DISCHARGE:                              OPERATIVE REPORT   PREOPERATIVE DIAGNOSIS:  Mass, antecubital fossa, right elbow.  POSTOPERATIVE DIAGNOSIS:  Mass, antecubital fossa, right elbow.  OPERATION:  Excisional biopsy of mass, right elbow antecubital fossa.  SURGEON:  Daryll Brod, M.D.  ANESTHESIA:  General with local infiltration.  ANESTHESIOLOGIST:  Ala Dach, M.D.  HISTORY:  The patient is a 53 year old female with a history of a mass in the antecubital fossa of her right elbow.  This has enlarged.  The MRI reveals a solid mass.  This appears physically to be in the interconnecting vein between the basilic and cephalic vein.  MRI reveals that it appears to be solid and filled.  Ultrasound reveals that it is not filled with blood.  She is desirous of having this excised.  Pre, peri, postoperative course have been discussed along with risks and complications.  She is aware that there is no guarantee with the surgery; possibility of infection; recurrence of injury to arteries, nerves, tendons; incomplete relief of symptoms; dystrophy.  In the preoperative area, the patient was seen, the extremity marked by both the patient and surgeon.  Antibiotic given.  PROCEDURE IN DETAIL:  The patient was brought to the operating room where a general anesthetic was carried out without difficulty under the direction of Dr. Orene Desanctis.  She was prepped using ChloraPrep, supine position with the right arm free.  A 3-minute dry time was allowed. Time-out taken, confirming the patient and procedure.  The limb was exsanguinated to the elbow.  Tourniquet placed high on the arm and was inflated to 250 mmHg.  A longitudinal incision was made directly  over the mass, carried down through subcutaneous tissue.  Bleeders were electrocauterized with bipolar.  The vein was found to be markedly enlarged, approximately a cm in diameter and a centimeter and half long. The nerve was directly beneath this with blunt and sharp dissection. This was dissected free.  The mass was solid, appeared to be the vein itself.  Clamps were placed proximally and distally in dual nature and the specimen cut between the two hemostats at level of the blood vessel, was then tied off with 2-0 Vicryl sutures.  The wound was copiously irrigated with saline.  The specimen was sent to Pathology.  The subcutaneous tissue was closed with interrupted 4-0 Vicryl and the skin with interrupted 4-0 Vicryl Rapide sutures.  Sterile compressive dressing was applied.  On deflation of the tourniquet, all fingers immediately pinked.  She was taken to the recovery room for observation in satisfactory condition.  She will be discharged home to return to the [facility] in 1 week on Vicodin.          ______________________________ Daryll Brod, M.D.     GK/MEDQ  D:  11/25/2013  T:  11/26/2013  Job:  [id number]

## 2013-11-28 ENCOUNTER — Encounter (HOSPITAL_BASED_OUTPATIENT_CLINIC_OR_DEPARTMENT_OTHER): Payer: Self-pay | Admitting: Orthopedic Surgery

## 2014-07-17 ENCOUNTER — Other Ambulatory Visit: Payer: Self-pay | Admitting: Obstetrics & Gynecology

## 2014-07-18 LAB — CYTOLOGY - PAP

## 2014-08-30 ENCOUNTER — Emergency Department (HOSPITAL_COMMUNITY)
Admission: EM | Admit: 2014-08-30 | Discharge: 2014-08-30 | Disposition: A | Discharge status: Home / Self Care | Payer: BC Managed Care – PPO | Attending: Emergency Medicine | Admitting: Emergency Medicine

## 2014-08-30 ENCOUNTER — Encounter (HOSPITAL_COMMUNITY): Payer: Self-pay

## 2014-08-30 ENCOUNTER — Emergency Department (HOSPITAL_COMMUNITY): Payer: BC Managed Care – PPO

## 2014-08-30 DIAGNOSIS — Z8719 Personal history of other diseases of the digestive system: Secondary | ICD-10-CM | POA: Diagnosis not present

## 2014-08-30 DIAGNOSIS — Z79899 Other long term (current) drug therapy: Secondary | ICD-10-CM | POA: Diagnosis not present

## 2014-08-30 DIAGNOSIS — R079 Chest pain, unspecified: Secondary | ICD-10-CM

## 2014-08-30 DIAGNOSIS — Z880 Allergy status to penicillin: Secondary | ICD-10-CM | POA: Diagnosis not present

## 2014-08-30 DIAGNOSIS — F419 Anxiety disorder, unspecified: Secondary | ICD-10-CM | POA: Insufficient documentation

## 2014-08-30 DIAGNOSIS — Z7951 Long term (current) use of inhaled steroids: Secondary | ICD-10-CM | POA: Insufficient documentation

## 2014-08-30 DIAGNOSIS — E079 Disorder of thyroid, unspecified: Secondary | ICD-10-CM | POA: Insufficient documentation

## 2014-08-30 LAB — I-STAT TROPONIN, ED
TROPONIN I, POC: 0 ng/mL (ref 0.00–0.08)
Troponin i, poc: 0 ng/mL (ref 0.00–0.08)

## 2014-08-30 LAB — CBC
HCT: 39.6 % (ref 36.0–46.0)
Hemoglobin: 13.2 g/dL (ref 12.0–15.0)
MCH: 31.8 pg (ref 26.0–34.0)
MCHC: 33.3 g/dL (ref 30.0–36.0)
MCV: 95.4 fL (ref 78.0–100.0)
Platelets: 243 10*3/uL (ref 150–400)
RBC: 4.15 MIL/uL (ref 3.87–5.11)
RDW: 12.4 % (ref 11.5–15.5)
WBC: 5.4 10*3/uL (ref 4.0–10.5)

## 2014-08-30 LAB — BASIC METABOLIC PANEL
Anion gap: 8 (ref 5–15)
BUN: 16 mg/dL (ref 6–23)
CHLORIDE: 104 mmol/L (ref 96–112)
CO2: 27 mmol/L (ref 19–32)
Calcium: 9.3 mg/dL (ref 8.4–10.5)
Creatinine, Ser: 0.86 mg/dL (ref 0.50–1.10)
GFR calc Af Amer: 87 mL/min — ABNORMAL LOW (ref >90)
GFR calc non Af Amer: 75 mL/min — ABNORMAL LOW (ref >90)
Glucose, Bld: 101 mg/dL — ABNORMAL HIGH (ref 70–99)
POTASSIUM: 4 mmol/L (ref 3.5–5.1)
Sodium: 139 mmol/L (ref 135–145)

## 2014-08-30 NOTE — ED Notes (Signed)
Patient transported to X-ray 

## 2014-08-30 NOTE — ED Notes (Signed)
Pt here for chest pain at 0130 sharp in nature lasted about 30 mins, took asa and reports recent stress in life.

## 2014-08-30 NOTE — ED Provider Notes (Signed)
CSN: [id number]     Arrival date & time 08/30/14  0328 History   First MD Initiated Contact with Patient 08/30/14 [phone]     Chief Complaint  Patient presents with  . Chest Pain     (Consider location/radiation/quality/duration/timing/severity/associated sxs/prior Treatment) HPI Comments: Pt comes in with cc of chest pain. Pt has no medical hx. Reports that she woke up in the middle of the night with sharp L sided chest pain that lasted for 30 minutes. Pt had some radiation to the L side. She is currently chest pain free. Pain had no known aggravating or relieving factors. There is no hx os smoking, premature CAd in the family. Pt hasnt had chest pain, dib leading upto today, even with exertion. Pt has no hx of PE, DVT and denies any exogenous estrogen use, long distance travels or surgery in the past 6 weeks, active cancer, recent immobilization.   The history is provided by the patient.    Past Medical History  Diagnosis Date  . Thyroid disease   . Anxiety   . No pertinent past medical history   . Appendicitis, acute 03/16/2012  . Thyroid disorder 03/16/2012  . PONV (postoperative nausea and vomiting)   . Wears glasses    Past Surgical History  Procedure Laterality Date  . Tubal ligation    . Dilation and curettage of uterus    . Laparoscopic appendectomy  03/15/2012    Procedure: APPENDECTOMY LAPAROSCOPIC;  Surgeon: Zenovia Jarred, MD;  Location: [facility];  Service: General;  Laterality: N/A;  . Tonsillectomy    . Total abdominal hysterectomy  2/15  . Colonoscopy    . Mass excision Right 11/25/2013    Procedure: BIOPSY MASS RIGHT ELBOW;  Surgeon: Wynonia Sours, MD;  Location: [facility];  Service: Orthopedics;  Laterality: Right;  excision of mass antecubital fossa right elbow   History reviewed. No pertinent family history. History  Substance Use Topics  . Smoking status: Never Smoker   . Smokeless tobacco: Not on file  . Alcohol Use: Yes   OB History    No data available     Review of Systems  Constitutional: Negative for activity change.  Respiratory: Negative for shortness of breath.   Cardiovascular: Positive for chest pain.  Gastrointestinal: Negative for nausea, vomiting and abdominal pain.  Genitourinary: Negative for dysuria.  Musculoskeletal: Negative for neck pain.  Neurological: Negative for headaches.      Allergies  Penicillins and Sulfa antibiotics  Home Medications   Prior to Admission medications   Medication Sig Start Date End Date Taking? Authorizing Provider  buPROPion (WELLBUTRIN XL) 300 MG 24 hr tablet Take 300 mg by mouth daily.   Yes Historical Provider, MD  fluticasone (FLONASE) 50 MCG/ACT nasal spray Place 1 spray into both nostrils daily.   Yes Historical Provider, MD  levothyroxine (SYNTHROID, LEVOTHROID) 100 MCG tablet Take 100 mcg by mouth daily. 08/06/14  Yes Historical Provider, MD  loratadine (CLARITIN) 10 MG tablet Take 10 mg by mouth daily as needed. For allergy symptoms   Yes Historical Provider, MD  PREMARIN 0.9 MG tablet Take 0.9 mg by mouth 2 (two) times daily. 08/01/14  Yes Historical Provider, MD  acetaminophen (TYLENOL) 325 MG tablet Take 2 tablets (650 mg total) by mouth every 4 (four) hours as needed. Patient not taking: Reported on 08/30/2014 03/16/12   Earnstine Regal, PA-C  HYDROcodone-acetaminophen [facility]) 5-325 MG per tablet Take 1 tablet by mouth every 6 (six) hours as needed  for moderate pain. Patient not taking: Reported on 08/30/2014 11/25/13   Daryll Brod, MD  ibuprofen (ADVIL) 200 MG tablet You can take 2-3 tablets every 6 hours as needed for pain. Patient not taking: Reported on 08/30/2014 03/16/12   Earnstine Regal, PA-C  promethazine (PHENERGAN) 6.25 MG/5ML syrup Take 10 mLs (12.5 mg total) by mouth every 6 (six) hours as needed for nausea or vomiting. Patient not taking: Reported on 08/30/2014 11/25/13   Daryll Brod, MD   BP 123/70 mmHg  Pulse 76  Temp(Src) 98.4 F (36.9 C) (Oral)   Resp 14  Ht 5\' 8"  (1.727 m)  Wt 135 lb (61.236 kg)  BMI 20.53 kg/m2  SpO2 100%  LMP 01/12/2012 Physical Exam  Constitutional: She is oriented to person, place, and time. She appears well-developed and well-nourished.  HENT:  Head: Normocephalic and atraumatic.  Eyes: EOM are normal. Pupils are equal, round, and reactive to light.  Neck: Neck supple.  Cardiovascular: Normal rate, regular rhythm and normal heart sounds.   No murmur heard. Pulmonary/Chest: Effort normal. No respiratory distress.  Abdominal: Soft. She exhibits no distension. There is no tenderness. There is no rebound and no guarding.  Neurological: She is alert and oriented to person, place, and time.  Skin: Skin is warm and dry.  Nursing note and vitals reviewed.   ED Course  Procedures (including critical care time) Labs Review Labs Reviewed  BASIC METABOLIC PANEL - Abnormal; Notable for the following:    Glucose, Bld 101 (*)    GFR calc non Af Amer 75 (*)    GFR calc Af Amer 87 (*)    All other components within normal limits  CBC  I-STAT TROPOININ, ED  I-STAT TROPOININ, ED    Imaging Review Dg Chest 2 View  08/30/2014   CLINICAL DATA:  Acute onset of left upper chest pain. Initial encounter.  EXAM: CHEST  2 VIEW  COMPARISON:  None.  FINDINGS: The lungs are well-aerated and clear. There is no evidence of focal opacification, pleural effusion or pneumothorax.  The heart is normal in size; the mediastinal contour is within normal limits. No acute osseous abnormalities are seen.  IMPRESSION: No acute cardiopulmonary process seen.   Electronically Signed   By: Garald Balding M.D.   On: 08/30/2014 03:55     EKG Interpretation   Date/Time:  Wednesday August 30 2014 03:32:35 EDT Ventricular Rate:  93 PR Interval:  126 QRS Duration: 78 QT Interval:  376 QTC Calculation: 467 R Axis:   82 Text Interpretation:  Normal sinus rhythm Normal ECG avR q wave without ST  changes No old tracing to compare Confirmed  by Trek Kimball, MD, Thelma Comp [phone])  on 08/30/2014 4:19:23 AM      MDM   Final diagnoses:  Chest pain, unspecified chest pain type    Pt with chest pain, atypical chest pain. HEART score is 2 (or max 3, even if symptoms are highly suspicion and 2 points given for symptoms alone)  - 1 for the history and age. Cards to contact patient for stress. Stable for discharge.  LATE ENTRY: Trish provided with patient 's info for an outpatient stress.   Varney Biles, MD 08/31/14 [phone]

## 2014-08-30 NOTE — Discharge Instructions (Signed)
We saw you in the ER for the chest pain/shortness of breath. All of our cardiac workup is normal, including labs, EKG and chest X-RAY are normal. We are not sure what is causing your discomfort, but we feel comfortable sending you home at this time.  Cardiology team should call you for an appointment/stress test. If you dont hear from them - call the number provided.   Chest Pain (Nonspecific) It is often hard to give a specific diagnosis for the cause of chest pain. There is always a chance that your pain could be related to something serious, such as a heart attack or a blood clot in the lungs. You need to follow up with your health care provider for further evaluation. CAUSES   Heartburn.  Pneumonia or bronchitis.  Anxiety or stress.  Inflammation around your heart (pericarditis) or lung (pleuritis or pleurisy).  A blood clot in the lung.  A collapsed lung (pneumothorax). It can develop suddenly on its own (spontaneous pneumothorax) or from trauma to the chest.  Shingles infection (herpes zoster virus). The chest wall is composed of bones, muscles, and cartilage. Any of these can be the source of the pain.  The bones can be bruised by injury.  The muscles or cartilage can be strained by coughing or overwork.  The cartilage can be affected by inflammation and become sore (costochondritis). DIAGNOSIS  Lab tests or other studies may be needed to find the cause of your pain. Your health care provider may have you take a test called an ambulatory electrocardiogram (ECG). An ECG records your heartbeat patterns over a 24-hour period. You may also have other tests, such as:  Transthoracic echocardiogram (TTE). During echocardiography, sound waves are used to evaluate how blood flows through your heart.  Transesophageal echocardiogram (TEE).  Cardiac monitoring. This allows your health care provider to monitor your heart rate and rhythm in real time.  Holter monitor. This is a  portable device that records your heartbeat and can help diagnose heart arrhythmias. It allows your health care provider to track your heart activity for several days, if needed.  Stress tests by exercise or by giving medicine that makes the heart beat faster. TREATMENT   Treatment depends on what may be causing your chest pain. Treatment may include:  Acid blockers for heartburn.  Anti-inflammatory medicine.  Pain medicine for inflammatory conditions.  Antibiotics if an infection is present.  You may be advised to change lifestyle habits. This includes stopping smoking and avoiding alcohol, caffeine, and chocolate.  You may be advised to keep your head raised (elevated) when sleeping. This reduces the chance of acid going backward from your stomach into your esophagus. Most of the time, nonspecific chest pain will improve within 2-3 days with rest and mild pain medicine.  HOME CARE INSTRUCTIONS   If antibiotics were prescribed, take them as directed. Finish them even if you start to feel better.  For the next few days, avoid physical activities that bring on chest pain. Continue physical activities as directed.  Do not use any tobacco products, including cigarettes, chewing tobacco, or electronic cigarettes.  Avoid drinking alcohol.  Only take medicine as directed by your health care provider.  Follow your health care provider's suggestions for further testing if your chest pain does not go away.  Keep any follow-up appointments you made. If you do not go to an appointment, you could develop lasting (chronic) problems with pain. If there is any problem keeping an appointment, call to reschedule. SEEK  MEDICAL CARE IF:   Your chest pain does not go away, even after treatment.  You have a rash with blisters on your chest.  You have a fever. SEEK IMMEDIATE MEDICAL CARE IF:   You have increased chest pain or pain that spreads to your arm, neck, jaw, back, or abdomen.  You  have shortness of breath.  You have an increasing cough, or you cough up blood.  You have severe back or abdominal pain.  You feel nauseous or vomit.  You have severe weakness.  You faint.  You have chills. This is an emergency. Do not wait to see if the pain will go away. Get medical help at once. Call your local emergency services (911 in [place] Do not drive yourself to the hospital. MAKE SURE YOU:   Understand these instructions.  Will watch your condition.  Will get help right away if you are not doing well or get worse. Document Released: 02/26/2005 Document Revised: 05/24/2013 Document Reviewed: 12/23/2007 [facility] Patient Information 2015 [place], [place]. This information is not intended to replace advice given to you by your health care provider. Make sure you discuss any questions you have with your health care provider.

## 2014-09-29 ENCOUNTER — Ambulatory Visit (INDEPENDENT_AMBULATORY_CARE_PROVIDER_SITE_OTHER): Payer: BC Managed Care – PPO | Admitting: Cardiology

## 2014-09-29 ENCOUNTER — Encounter: Payer: Self-pay | Admitting: Cardiology

## 2014-09-29 VITALS — BP 134/72 | HR 83 | Ht 68.0 in | Wt 138.0 lb

## 2014-09-29 DIAGNOSIS — R079 Chest pain, unspecified: Secondary | ICD-10-CM | POA: Diagnosis not present

## 2014-09-29 DIAGNOSIS — R072 Precordial pain: Secondary | ICD-10-CM | POA: Diagnosis not present

## 2014-09-29 NOTE — Progress Notes (Signed)
Cardiology Office Note   Date:  09/29/2014   ID:  Vicki Abbott, DOB 11-11-1960, MRN [id number]  PCP:  Tivis Ringer, MD  Cardiologist:   Minus Breeding, MD   No chief complaint on file.     History of Present Illness: Vicki Abbott is a 54 y.o. female who presents for evaluation of chest discomfort. He has no past cardiac history. However, she presented to the emergency room on March 30 with chest pain. This woke her from her sleep. It was sharp. It would come  "in and out"  .  She was mildly short of breath with this. There was some nausea. In the emergency room she had no acute findings that I did review these records. She had negative enzymes. EKG was unremarkable. She was released from the emergency room and had one further episode of this kind of discomfort. Since that time she's had none of this sharp 6 out of 10 discomfort that she has had slight aching when she takes a deep breath.  She walks routinely for exercise she might have the discomfort when she is breathing hard. This is mild. She doesn't describe radiation to her neck or to her arms. She's never had this kind of discomfort before. She's had no prior cardiac workup.  Of note the patient does have significant stress taking care of her aging mother.    Past Medical History  Diagnosis Date  . Anxiety   . Thyroid disorder 03/16/2012  . PONV (postoperative nausea and vomiting)     Past Surgical History  Procedure Laterality Date  . Tubal ligation    . Dilation and curettage of uterus    . Laparoscopic appendectomy  03/15/2012    Procedure: APPENDECTOMY LAPAROSCOPIC;  Surgeon: Zenovia Jarred, MD;  Location: [facility];  Service: General;  Laterality: N/A;  . Tonsillectomy    . Total abdominal hysterectomy  2/15  . Colonoscopy    . Mass excision Right 11/25/2013    Procedure: BIOPSY MASS RIGHT ELBOW;  Surgeon: Wynonia Sours, MD;  Location: [facility];  Service: Orthopedics;  Laterality: Right;   excision of mass antecubital fossa right elbow  . Appendectomy       Current Outpatient Prescriptions  Medication Sig Dispense Refill  . acetaminophen (TYLENOL) 325 MG tablet Take 2 tablets (650 mg total) by mouth every 4 (four) hours as needed.    Marland Kitchen buPROPion (WELLBUTRIN XL) 300 MG 24 hr tablet Take 300 mg by mouth daily.    . fluticasone (FLONASE) 50 MCG/ACT nasal spray Place 1 spray into both nostrils daily.    Marland Kitchen HYDROcodone-acetaminophen (NORCO) 5-325 MG per tablet Take 1 tablet by mouth every 6 (six) hours as needed for moderate pain. 30 tablet 0  . ibuprofen (ADVIL) 200 MG tablet You can take 2-3 tablets every 6 hours as needed for pain. 30 tablet 0  . levothyroxine (SYNTHROID, LEVOTHROID) 100 MCG tablet Take 100 mcg by mouth daily.  4  . loratadine (CLARITIN) 10 MG tablet Take 10 mg by mouth daily as needed. For allergy symptoms    . PREMARIN 0.9 MG tablet Take 0.9 mg by mouth 2 (two) times daily.  12  . promethazine (PHENERGAN) 6.25 MG/5ML syrup Take 10 mLs (12.5 mg total) by mouth every 6 (six) hours as needed for nausea or vomiting. 20 mL 0   No current facility-administered medications for this visit.    Allergies:   Penicillins and Sulfa antibiotics  Social History:  The patient  reports that she has never smoked. She does not have any smokeless tobacco history on file. She reports that she drinks alcohol. She reports that she does not use illicit drugs.   Family History:  The patient's family history includes Diabetes in her mother; Hypertension in her father and mother; Stroke (age of onset: 37) in her mother.    ROS:  Please see the history of present illness.   Otherwise, review of systems are positive for nausea, anxiety stress.   All other systems are reviewed and negative.    PHYSICAL EXAM: VS:  BP 134/72 mmHg  Pulse 83  Ht 5\' 8"  (1.727 m)  Wt 138 lb (62.596 kg)  BMI 20.99 kg/m2  LMP 01/12/2012 , BMI Body mass index is 20.99 kg/(m^2). GENERAL:  Well  appearing HEENT:  Pupils equal round and reactive, fundi not visualized, oral mucosa unremarkable NECK:  No jugular venous distention, waveform within normal limits, carotid upstroke brisk and symmetric, no bruits, no thyromegaly LYMPHATICS:  No cervical, inguinal adenopathy LUNGS:  Clear to auscultation bilaterally BACK:  No CVA tenderness CHEST:  Unremarkable HEART:  PMI not displaced or sustained,S1 and S2 within normal limits, no S3, no S4, no clicks, no rubs, no murmurs ABD:  Flat, positive bowel sounds normal in frequency in pitch, no bruits, no rebound, no guarding, no midline pulsatile mass, no hepatomegaly, no splenomegaly EXT:  2 plus pulses throughout, no edema, no cyanosis no clubbing SKIN:  No rashes no nodules NEURO:  Cranial nerves II through XII grossly intact, motor grossly intact throughout PSYCH:  Cognitively intact, oriented to person place and time    EKG:  EKG is ordered today. The ekg ordered today demonstrates sinus rhythm, rate 83, axis within normal limits, intervals within normal limits, no acute ST-T wave changes.   Recent Labs: 08/30/2014: BUN 16; Creatinine 0.86; Hemoglobin 13.2; Platelets 243; Potassium 4.0; Sodium 139    Wt Readings from Last 3 Encounters:  09/29/14 138 lb (62.596 kg)  08/30/14 135 lb (61.236 kg)  11/25/13 130 lb 3.2 oz (59.058 kg)      Other studies Reviewed: Additional studies/ records that were reviewed today include: ED records. Review of the above records demonstrates:  Please see elsewhere in the note.     ASSESSMENT AND PLAN:  CHEST PAIN:   Her pain is somewhat atypical.  I will bring the patient back for a POET (Plain Old Exercise Test). This will allow me to screen for obstructive coronary disease, risk stratify and very importantly provide a prescription for exercise.  If this is normal then no further cardiac workup would be suggested.    Current medicines are reviewed at length with the patient today.  The patient  does not have concerns regarding medicines.  The following changes have been made:  no change  Labs/ tests ordered today include:   Orders Placed This Encounter  Procedures  . Exercise Tolerance Test     Disposition:   FU with me as needed    Signed, Minus Breeding, MD  09/29/2014 4:13 PM    [facility]

## 2014-09-29 NOTE — Patient Instructions (Signed)
Your physician has requested that you have an exercise tolerance test. For further information please visit [email or web address]. Please also follow instruction sheet, as given.  Your physician recommends that you schedule a follow-up appointment AS NEEDED  Exercise Stress Electrocardiogram An exercise stress electrocardiogram is a test that is done to evaluate the blood supply to your heart. This test may also be called exercise stress electrocardiography. The test is done while you are walking on a treadmill. The goal of this test is to raise your heart rate. This test is done to find areas of poor blood flow to the heart by determining the extent of coronary artery disease (CAD).   CAD is defined as narrowing in one or more heart (coronary) arteries of more than 70%. If you have an abnormal test result, this may mean that you are not getting adequate blood flow to your heart during exercise. Additional testing may be needed to understand why your test was abnormal. LET [facility] CARE PROVIDER KNOW ABOUT:   Any allergies you have.  All medicines you are taking, including vitamins, herbs, eye drops, creams, and over-the-counter medicines.  Previous problems you or members of your family have had with the use of anesthetics.  Any blood disorders you have.  Previous surgeries you have had.  Medical conditions you have.  Possibility of pregnancy, if this applies. RISKS AND COMPLICATIONS Generally, this is a safe procedure. However, as with any procedure, complications can occur. Possible complications can include:  Pain or pressure in the following areas:  Chest.  Jaw or neck.  Between your shoulder blades.  Radiating down your left arm.  Dizziness or light-headedness.  Shortness of breath.  Increased or irregular heartbeats.  Nausea or vomiting.  Heart attack (rare). BEFORE THE PROCEDURE  Avoid all forms of caffeine 24 hours before your test or as directed by your health  care provider. This includes coffee, tea (even decaffeinated tea), caffeinated sodas, chocolate, cocoa, and certain pain medicines.  Follow your health care provider's instructions regarding eating and drinking before the test.  Take your medicines as directed at regular times with water unless instructed otherwise. Exceptions may include:  If you have diabetes, ask how you are to take your insulin or pills. It is common to adjust insulin dosing the morning of the test.  If you are taking beta-blocker medicines, it is important to talk to your health care provider about these medicines well before the date of your test. Taking beta-blocker medicines may interfere with the test. In some cases, these medicines need to be changed or stopped 24 hours or more before the test.  If you wear a nitroglycerin patch, it may need to be removed prior to the test. Ask your health care provider if the patch should be removed before the test.  If you use an inhaler for any breathing condition, bring it with you to the test.  If you are an outpatient, bring a snack so you can eat right after the stress phase of the test.  Do not smoke for 4 hours prior to the test or as directed by your health care provider.  Do not apply lotions, powders, creams, or oils on your chest prior to the test.  Wear loose-fitting clothes and comfortable shoes for the test. This test involves walking on a treadmill. PROCEDURE  Multiple patches (electrodes) will be put on your chest. If needed, small areas of your chest may have to be shaved to get better contact with the electrodes.  Once the electrodes are attached to your body, multiple wires will be attached to the electrodes and your heart rate will be monitored.  Your heart will be monitored both at rest and while exercising.  You will walk on a treadmill. The treadmill will be started at a slow pace. The treadmill speed and incline will gradually be increased to raise your  heart rate. AFTER THE PROCEDURE  Your heart rate and blood pressure will be monitored after the test.  You may return to your normal schedule including diet, activities, and medicines, unless your health care provider tells you otherwise. Document Released: 05/16/2000 Document Revised: 05/24/2013 Document Reviewed: 01/24/2013 [facility] Patient Information 2015 [place], [place]. This information is not intended to replace advice given to you by your health care provider. Make sure you discuss any questions you have with your health care provider.

## 2014-10-01 DIAGNOSIS — R072 Precordial pain: Secondary | ICD-10-CM | POA: Insufficient documentation

## 2014-10-02 NOTE — Addendum Note (Signed)
Addended byChauncy Lean. on: 10/02/2014 12:22 PM   Modules accepted: Orders

## 2014-10-13 ENCOUNTER — Ambulatory Visit (HOSPITAL_COMMUNITY)
Admission: RE | Admit: 2014-10-13 | Discharge: 2014-10-13 | Disposition: A | Discharge status: Home / Self Care | Payer: BC Managed Care – PPO | Source: Ambulatory Visit | Attending: Cardiovascular Disease | Admitting: Cardiovascular Disease

## 2014-10-13 DIAGNOSIS — R079 Chest pain, unspecified: Secondary | ICD-10-CM | POA: Insufficient documentation

## 2014-10-13 LAB — EXERCISE TOLERANCE TEST
CHL CUP MPHR: 165 {beats}/min
CHL CUP STRESS STAGE 1 HR: 93 {beats}/min
CHL CUP STRESS STAGE 2 SPEED: 0.8 mph
CHL CUP STRESS STAGE 3 HR: 99 {beats}/min
CHL CUP STRESS STAGE 4 DBP: 64 mmHg
CHL CUP STRESS STAGE 4 SBP: 156 mmHg
CHL CUP STRESS STAGE 6 GRADE: 14 %
CHL CUP STRESS STAGE 6 SBP: 151 mmHg
CHL CUP STRESS STAGE 7 GRADE: 14 %
CHL CUP STRESS STAGE 7 HR: 162 {beats}/min
CHL CUP STRESS STAGE 8 DBP: 70 mmHg
CHL CUP STRESS STAGE 8 SBP: 149 mmHg
CHL CUP STRESS STAGE 9 DBP: 75 mmHg
CHL CUP STRESS STAGE 9 SBP: 131 mmHg
CSEPHR: 98 %
CSEPPHR: 162 {beats}/min
Estimated workload: 10.1 METS
Exercise duration (min): 9 min
Percent of predicted max HR: 98 %
RPE: 24462
Rest HR: 88 {beats}/min
Stage 1 DBP: 90 mmHg
Stage 1 Grade: 0 %
Stage 1 SBP: 125 mmHg
Stage 1 Speed: 0 mph
Stage 2 Grade: 0 %
Stage 2 HR: 98 {beats}/min
Stage 3 Grade: 0.1 %
Stage 3 Speed: 1 mph
Stage 4 Grade: 10 %
Stage 4 HR: 125 {beats}/min
Stage 4 Speed: 1.7 mph
Stage 5 DBP: 69 mmHg
Stage 5 Grade: 12 %
Stage 5 HR: 139 {beats}/min
Stage 5 SBP: 153 mmHg
Stage 5 Speed: 2.5 mph
Stage 6 DBP: 68 mmHg
Stage 6 HR: 162 {beats}/min
Stage 6 Speed: 3.4 mph
Stage 7 Speed: 3.4 mph
Stage 8 Grade: 0 %
Stage 8 HR: 146 {beats}/min
Stage 8 Speed: 0 mph
Stage 9 Grade: 0 %
Stage 9 HR: 95 {beats}/min
Stage 9 Speed: 0 mph

## 2014-10-31 ENCOUNTER — Encounter (HOSPITAL_COMMUNITY): Payer: BC Managed Care – PPO

## 2015-01-31 ENCOUNTER — Other Ambulatory Visit: Payer: Self-pay | Admitting: Obstetrics & Gynecology

## 2015-02-02 LAB — CYTOLOGY - PAP

## 2015-06-11 ENCOUNTER — Other Ambulatory Visit: Payer: Self-pay | Admitting: Gastroenterology

## 2015-06-11 DIAGNOSIS — R1032 Left lower quadrant pain: Secondary | ICD-10-CM

## 2015-06-12 ENCOUNTER — Ambulatory Visit
Admission: RE | Admit: 2015-06-12 | Discharge: 2015-06-12 | Disposition: A | Discharge status: Home / Self Care | Payer: BC Managed Care – PPO | Source: Ambulatory Visit | Attending: Gastroenterology | Admitting: Gastroenterology

## 2015-06-12 DIAGNOSIS — R1032 Left lower quadrant pain: Secondary | ICD-10-CM

## 2015-06-12 MED ORDER — IOPAMIDOL (ISOVUE-300) INJECTION 61%
100.0000 mL | Freq: Once | INTRAVENOUS | Status: AC | PRN
  Administered 2015-06-12: 100 mL via INTRAVENOUS

## 2015-07-12 DIAGNOSIS — R1084 Generalized abdominal pain: Secondary | ICD-10-CM | POA: Insufficient documentation

## 2015-07-12 DIAGNOSIS — R399 Unspecified symptoms and signs involving the genitourinary system: Secondary | ICD-10-CM | POA: Insufficient documentation

## 2016-05-06 DIAGNOSIS — G588 Other specified mononeuropathies: Secondary | ICD-10-CM | POA: Insufficient documentation

## 2016-05-06 DIAGNOSIS — N94819 Vulvodynia, unspecified: Secondary | ICD-10-CM | POA: Insufficient documentation

## 2016-05-06 DIAGNOSIS — N9419 Other specified dyspareunia: Secondary | ICD-10-CM | POA: Insufficient documentation

## 2016-07-23 ENCOUNTER — Other Ambulatory Visit: Payer: Self-pay | Admitting: Obstetrics & Gynecology

## 2016-07-23 DIAGNOSIS — R928 Other abnormal and inconclusive findings on diagnostic imaging of breast: Secondary | ICD-10-CM

## 2016-07-24 DIAGNOSIS — L609 Nail disorder, unspecified: Secondary | ICD-10-CM | POA: Insufficient documentation

## 2016-07-24 DIAGNOSIS — N3010 Interstitial cystitis (chronic) without hematuria: Secondary | ICD-10-CM | POA: Insufficient documentation

## 2016-07-28 ENCOUNTER — Ambulatory Visit
Admission: RE | Admit: 2016-07-28 | Discharge: 2016-07-28 | Disposition: A | Discharge status: Home / Self Care | Payer: BC Managed Care – PPO | Source: Ambulatory Visit | Attending: Obstetrics & Gynecology | Admitting: Obstetrics & Gynecology

## 2016-07-28 DIAGNOSIS — R928 Other abnormal and inconclusive findings on diagnostic imaging of breast: Secondary | ICD-10-CM

## 2016-08-26 ENCOUNTER — Encounter: Payer: Self-pay | Admitting: Physical Therapy

## 2016-08-26 ENCOUNTER — Ambulatory Visit: Payer: BC Managed Care – PPO | Attending: Obstetrics and Gynecology | Admitting: Physical Therapy

## 2016-08-26 DIAGNOSIS — G8929 Other chronic pain: Secondary | ICD-10-CM | POA: Diagnosis present

## 2016-08-26 DIAGNOSIS — R252 Cramp and spasm: Secondary | ICD-10-CM | POA: Diagnosis present

## 2016-08-26 DIAGNOSIS — M545 Low back pain: Secondary | ICD-10-CM | POA: Insufficient documentation

## 2016-08-26 DIAGNOSIS — R279 Unspecified lack of coordination: Secondary | ICD-10-CM

## 2016-08-26 DIAGNOSIS — M6281 Muscle weakness (generalized): Secondary | ICD-10-CM | POA: Diagnosis present

## 2016-08-26 NOTE — Patient Instructions (Signed)
Massage the side of the vagina and between the vagina and anal area 3 min daily  Strengthen the hip abductors, and lower abdominals with spinal neutral  Piriformis Stretch, Sitting    Sit, one ankle on opposite knee, same-side hand on crossed knee. Push down on knee, keeping spine straight. Lean torso forward, with flat back, until tension is felt in hamstrings and gluteals of crossed-leg side. Hold _30__ seconds.  Repeat _2__ times per session. Do _1__ sessions per day.  Copyright  VHI. All rights reserved.  Butterfly, Supine    Lie on back, feet together. Lower knees toward floor. Hold _30__ seconds. Repeat __2_ times per session. Do __2_ sessions per day.  Copyright  VHI. All rights reserved.  [facility] [address], [place],  [address] Phone # [phone] Fax [phone]

## 2016-08-26 NOTE — Therapy (Signed)
[facility] [address] [place], [place], [address] Phone: [phone]   Fax:  [phone]  Physical Therapy Evaluation  Patient Details  Name: Vicki Abbott MRN: [id number] Date of Birth: [birth year] Referring Provider: Dr. Junie Panning carey  Encounter Date: 08/26/2016      PT End of Session - 08/26/16 1414    Visit Number 1   Date for PT Re-Evaluation 12/26/16   Authorization Type BCBS   PT Start Time 0845   PT Stop Time 0930   PT Time Calculation (min) 45 min   Activity Tolerance Patient tolerated treatment well      Past Medical History:  Diagnosis Date  . Anxiety   . PONV (postoperative nausea and vomiting)   . Thyroid disorder 03/16/2012    Past Surgical History:  Procedure Laterality Date  . APPENDECTOMY    . COLONOSCOPY    . DILATION AND CURETTAGE OF UTERUS    . LAPAROSCOPIC APPENDECTOMY  03/15/2012   Procedure: APPENDECTOMY LAPAROSCOPIC;  Surgeon: Zenovia Jarred, MD;  Location: [place];  Service: General;  Laterality: N/A;  . MASS EXCISION Right 11/25/2013   Procedure: BIOPSY MASS RIGHT ELBOW;  Surgeon: Wynonia Sours, MD;  Location: [place];  Service: Orthopedics;  Laterality: Right;  excision of mass antecubital fossa right elbow  . TONSILLECTOMY    . TOTAL ABDOMINAL HYSTERECTOMY  2/15  . TUBAL LIGATION      There were no vitals filed for this visit.       Subjective Assessment - 08/26/16 0857    Subjective Patient reports hysterectomy 2015 and started to have bladder Patient has been ruled out with diverticulitis, IC. Now patient has cramps in lower abdominal and back pain.    Patient Stated Goals reduce abdominal and lower back pain   Currently in Pain? Yes   Pain Score 4    Pain Location Abdomen   Pain Orientation Lower   Pain Descriptors / Indicators Aching;Dull   Pain Type Chronic pain   Pain Onset More than a month ago   Pain Frequency Constant   Aggravating  Factors  at night, when sitting still   Pain Relieving Factors heat   Multiple Pain Sites Yes   Pain Score 4   Pain Location Back   Pain Orientation Lower   Pain Descriptors / Indicators Discomfort   Pain Type Chronic pain   Pain Onset More than a month ago   Pain Frequency Intermittent   Aggravating Factors  movement; squats, turining, sitting   Pain Relieving Factors not sure            [facility] PT Assessment - 08/26/16 0001      Assessment   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 Junie Panning carey   Onset Date/Surgical Date 07/12/14   Prior Therapy chiropractor     Precautions   Precautions None     Restrictions   Weight Bearing Restrictions No     Balance Screen   Has the patient fallen in the past 6 months No   Has the patient had a decrease in activity level because of a fear of falling?  No   Is the patient reluctant to leave their home because of a fear of falling?  No     Home Ecologist residence     Prior Function   Level of Independence Independent   Vocation Retired   Leisure personal  trainer     Cognition   Overall Cognitive Status Within Functional Limits for tasks assessed     Observation/Other Assessments   Focus on Therapeutic Outcomes (FOTO)  25% limitation for PFDI     Posture/Postural Control   Posture/Postural Control No significant limitations     ROM / Strength   AROM / PROM / Strength AROM;PROM;Strength     AROM   Lumbar Extension decreased by 25%   Lumbar - Right Side Bend decreased by 25%   Lumbar - Left Side Bend decreased by 25%     PROM   Right Hip External Rotation  50   Left Hip External Rotation  40     Strength   Left Hip ABduction 4/5     Palpation   SI assessment  left ilium is posteriorly rotated   Palpation comment tenderness and tightness in lower abdomina,      Transfers   Transfers Not assessed     Ambulation/Gait   Ambulation/Gait No                  Pelvic Floor Special Questions - 08/26/16 0001    Prior Pregnancies No   Currently Sexually Active Yes   Is this Painful Yes  in her back   Marinoff Scale discomfort that does not affect completion   Urinary Leakage Yes   Pad use 0   Activities that cause leaking With strong urge  strong urge with sneeze   Fecal incontinence --  some issue with constipation; strain   Perineal Body/Introitus  Normal   External Palpation palpable tenderness located on bil. bulbocavernosus; left ishiocavernosus; superior transverse perineum; tightness located just above th pubic bone   Pelvic Floor Internal Exam patient confirms identification and approves PT to assess pelvic floor muscle integrity   Exam Type Vaginal   Palpation q-tip test with discomfort at 10 o,clock; tenderness located on left iliococcygeus, bil. obturator intenist, tightness on right urethra sphincter and bil. sides of bladder   Strength weak squeeze, no lift                  PT Education - 08/26/16 1413    Education provided Yes   Education Details outside perineal massage; piriformis and butterfly stretch   Person(s) Educated Patient   Methods Explanation;Handout;Demonstration   Comprehension Verbalized understanding;Returned demonstration          PT Short Term Goals - 08/26/16 1424      PT SHORT TERM GOAL #1   Title independent with perineal massage   Time 4   Period Weeks   Status New     PT SHORT TERM GOAL #2   Title independent with flexibility program   Time 4   Period Weeks   Status New     PT SHORT TERM GOAL #3   Title lower abdominal pain is intermittent and 25% better at night   Time 4   Period Weeks   Status New     PT SHORT TERM GOAL #4   Title lumbar pain decreased >/= 25% with squatting and sitting   Time 4   Period Weeks   Status New           PT Long Term Goals - 08/26/16 1425      PT LONG TERM GOAL #1   Title independent with HEP and what she  should focus on with her personal trainer   Time 4   Period Months   Status New  PT LONG TERM GOAL #2   Title lower abdominal pain decreased >/= 75% at night   Time 4   Period Months   Status New     PT LONG TERM GOAL #3   Title lower back pain decreased >/= 75% with squatting, sitting, and turning   Time 4   Period Months   Status New     PT LONG TERM GOAL #4   Title pelvic floor strength increases to 4/5 due to decreased muscle spasms and ability to bulge the pelvic floor   Time 4   Period Months   Status New     PT LONG TERM GOAL #5   Title ability to perfrom daily activities with >/=75% greater ease due to decreased pain   Time 4   Period Months               Plan - 08/26/16 1415    Clinical Impression Statement Patient is a 56 year old female with pelvic pain and back pain.  Patient reports she is having pain since she had her hysterectomy 07/2014. Patient perineal pain has improved with the medications the doctor has her taking.  Lumbar pain is intermittent at level 4/10 with squats, turning, and sitting.  Patient  lower abdominal pain is constant at level 4/10 with being still and at night.  Patient has difficulty with bulging of the pelvic floor.  Pelvic floor strength is 2/5 with urinary leakage when she has the strong urge with a full bladder.  Palpable tenderness located on bilateral bulbocavernosus, right ishciocavernosus, superior transverse perineau, left ilicoccygeus, bil. obturator internist, and right side of urethra sphinter.  Patient is seeing a personal trainer 2 times per week.  Q-tip tes showed tenderness located on 10 O'clock position.  Patient is moderately complex patient with an evolving contion and comorbidities including s/p hysterectomy, endometriosus, vulvodynia, and fibroids that could impact care.  Patient will benefit from physical therapy to reduce her pain to improve function.    Rehab Potential Excellent   Clinical Impairments Affecting  Rehab Potential None   PT Frequency 1x / week   PT Duration Other (comment)  4 months   PT Treatment/Interventions Biofeedback;Cryotherapy;Electrical Stimulation;Iontophoresis 4mg /ml Dexamethasone;Moist Heat;Traction;Ultrasound;Patient/family education;Neuromuscular re-education;Balance training;Therapeutic exercise;Therapeutic activities;Manual techniques;Passive range of motion;Dry needling;Energy conservation;Taping   PT Next Visit Plan correct pelvis; soft tissue work lower abdominal, abdominal massage, internal soft tissue work, perineal bulging   PT Home Exercise Plan progress as needed   Recommended Other Services None   Consulted and Agree with Plan of Care Patient      Patient will benefit from skilled therapeutic intervention in order to improve the following deficits and impairments:  Decreased range of motion, Increased fascial restricitons, Increased muscle spasms, Pain, Decreased strength, Decreased mobility  Visit Diagnosis: Muscle weakness (generalized) - Plan: PT plan of care cert/re-cert  Chronic bilateral low back pain without sciatica - Plan: PT plan of care cert/re-cert  Cramp and spasm - Plan: PT plan of care cert/re-cert  Unspecified lack of coordination - Plan: PT plan of care cert/re-cert     Problem List Patient Active Problem List   Diagnosis Date Noted  . Precordial chest pain 10/01/2014  . Appendicitis, acute 03/16/2012  . Thyroid disorder 03/16/2012    Earlie Counts, PT 08/26/16 2:32 PM   [facility] [address], [place], [place], [address] Phone: [phone]   Fax:  [phone]  Name: Vicki Abbott MRN: [id number] Date of Birth:  09/02/1960  

## 2016-09-09 ENCOUNTER — Encounter: Payer: Self-pay | Admitting: Physical Therapy

## 2016-09-09 ENCOUNTER — Ambulatory Visit: Payer: BC Managed Care – PPO | Attending: Obstetrics and Gynecology | Admitting: Physical Therapy

## 2016-09-09 DIAGNOSIS — G8929 Other chronic pain: Secondary | ICD-10-CM | POA: Insufficient documentation

## 2016-09-09 DIAGNOSIS — M6281 Muscle weakness (generalized): Secondary | ICD-10-CM | POA: Diagnosis present

## 2016-09-09 DIAGNOSIS — R252 Cramp and spasm: Secondary | ICD-10-CM | POA: Insufficient documentation

## 2016-09-09 DIAGNOSIS — M545 Low back pain: Secondary | ICD-10-CM | POA: Insufficient documentation

## 2016-09-09 DIAGNOSIS — R279 Unspecified lack of coordination: Secondary | ICD-10-CM | POA: Insufficient documentation

## 2016-09-09 NOTE — Therapy (Signed)
[facility] [address] [place], [place], [address] Phone: [phone]   Fax:  [phone]  Physical Therapy Treatment  Patient Details  Name: Vicki Abbott MRN: [id number] Date of Birth: 07/04/1960 Referring Provider: Dr. Junie Panning carey  Encounter Date: 09/09/2016      PT End of Session - 09/09/16 1235    Visit Number 2   Date for PT Re-Evaluation 12/26/16   Authorization Type BCBS   PT Start Time 1145   PT Stop Time 1230   PT Time Calculation (min) 45 min   Activity Tolerance Patient tolerated treatment well   Behavior During Therapy [facility] for tasks assessed/performed      Past Medical History:  Diagnosis Date  . Anxiety   . PONV (postoperative nausea and vomiting)   . Thyroid disorder 03/16/2012    Past Surgical History:  Procedure Laterality Date  . APPENDECTOMY    . COLONOSCOPY    . DILATION AND CURETTAGE OF UTERUS    . LAPAROSCOPIC APPENDECTOMY  03/15/2012   Procedure: APPENDECTOMY LAPAROSCOPIC;  Surgeon: Zenovia Jarred, MD;  Location: [place];  Service: General;  Laterality: N/A;  . MASS EXCISION Right 11/25/2013   Procedure: BIOPSY MASS RIGHT ELBOW;  Surgeon: Wynonia Sours, MD;  Location: [place];  Service: Orthopedics;  Laterality: Right;  excision of mass antecubital fossa right elbow  . TONSILLECTOMY    . TOTAL ABDOMINAL HYSTERECTOMY  2/15  . TUBAL LIGATION      There were no vitals filed for this visit.      Subjective Assessment - 09/09/16 1150    Subjective When I am stressed I have increased cramps which happened for the first time. Back is feeling better. Has stopped the Welbutrin the past 3 weeks and feels better.    Patient Stated Goals reduce abdominal and lower back pain   Currently in Pain? No/denies            [facility] PT Assessment - 09/09/16 0001      Palpation   SI assessment  Pelvis in correct alignment                  Pelvic Floor  Special Questions - 09/09/16 0001    Pelvic Floor Internal Exam patient confirms identification and approves PT to assess pelvic floor muscle integrity   Exam Type Vaginal           [facility] Adult PT Treatment/Exercise - 09/09/16 0001      Manual Therapy   Manual Therapy Soft tissue mobilization;Myofascial release;Internal Pelvic Floor   Soft tissue mobilization circular abdominal massage to improve perastalic movement of the intestines   Myofascial Release lower abdominal area with releasing bladder from vagina; release of the left lower abdomen    Internal Pelvic Floor bil. obturator internist, bil. puborectalis, right urethra sphincter, and posterior introitus                PT Education - 09/09/16 1234    Education provided Yes   Education Details abdominal massage, perineal massage, bulging of pelvic floor   Person(s) Educated Patient   Methods Explanation;Demonstration;Verbal cues;Handout   Comprehension Returned demonstration;Verbalized understanding          PT Short Term Goals - 09/09/16 1152      PT SHORT TERM GOAL #1   Title independent with perineal massage   Time 4   Period Weeks   Status On-going     PT SHORT TERM  GOAL #2   Title independent with flexibility program   Time 4   Period Weeks   Status On-going     PT SHORT TERM GOAL #3   Title lower abdominal pain is intermittent and 25% better at night   Time 4   Period Weeks   Status Achieved     PT SHORT TERM GOAL #4   Title lumbar pain decreased >/= 25% with squatting and sitting   Time 4   Period Weeks   Status Achieved           PT Long Term Goals - 08/26/16 1425      PT LONG TERM GOAL #1   Title independent with HEP and what she should focus on with her personal trainer   Time 4   Period Months   Status New     PT LONG TERM GOAL #2   Title lower abdominal pain decreased >/= 75% at night   Time 4   Period Months   Status New     PT LONG TERM GOAL #3   Title lower back  pain decreased >/= 75% with squatting, sitting, and turning   Time 4   Period Months   Status New     PT LONG TERM GOAL #4   Title pelvic floor strength increases to 4/5 due to decreased muscle spasms and ability to bulge the pelvic floor   Time 4   Period Months   Status New     PT LONG TERM GOAL #5   Title ability to perfrom daily activities with >/=75% greater ease due to decreased pain   Time 4   Period Months               Plan - 09/09/16 1235    Clinical Impression Statement Patient has improved lower back pain and cramping of the lower abdominal.  Patient has been doing her exercises. Patient had more tightness in right pelvic floor muscles.  Patient had improve mobility of the left urethra spincter.  Patient had no pain after therapy.  Patient will benefit from skilled therapy to reduce pain and improve funciton.    Rehab Potential Excellent   Clinical Impairments Affecting Rehab Potential None   PT Frequency 1x / week   PT Duration Other (comment)  4 months   PT Treatment/Interventions Biofeedback;Cryotherapy;Electrical Stimulation;Iontophoresis 4mg /ml Dexamethasone;Moist Heat;Traction;Ultrasound;Patient/family education;Neuromuscular re-education;Balance training;Therapeutic exercise;Therapeutic activities;Manual techniques;Passive range of motion;Dry needling;Energy conservation;Taping   PT Next Visit Plan  soft tissue work lower abdominal, abdominal massage, internal soft tissue work, perineal bulging; stretches   PT Home Exercise Plan progress as needed   Consulted and Agree with Plan of Care Patient      Patient will benefit from skilled therapeutic intervention in order to improve the following deficits and impairments:  Decreased range of motion, Increased fascial restricitons, Increased muscle spasms, Pain, Decreased strength, Decreased mobility  Visit Diagnosis: Muscle weakness (generalized)  Chronic bilateral low back pain without sciatica  Cramp and  spasm  Unspecified lack of coordination     Problem List Patient Active Problem List   Diagnosis Date Noted  . Precordial chest pain 10/01/2014  . Appendicitis, acute 03/16/2012  . Thyroid disorder 03/16/2012    Earlie Counts, PT 09/09/16 12:38 PM   [facility] [address] [place], [place], [address] Phone: [phone]   Fax:  [phone]  Name: Vicki Abbott MRN: [id number] Date of Birth: January 06, 1961

## 2016-09-09 NOTE — Patient Instructions (Addendum)
About Abdominal Massage  Abdominal massage, also called external colon massage, is a self-treatment circular massage technique that can reduce and eliminate gas and ease constipation. The colon naturally contracts in waves in a clockwise direction starting from inside the right hip, moving up toward the ribs, across the belly, and down inside the left hip.  When you perform circular abdominal massage, you help stimulate your colon's normal wave pattern of movement called peristalsis.  It is most beneficial when done after eating.  Positioning You can practice abdominal massage with oil while lying down, or in the shower with soap.  Some people find that it is just as effective to do the massage through clothing while sitting or standing.  How to Massage Start by placing your finger tips or knuckles on your right side, just inside your hip bone.  . Make small circular movements while you move upward toward your rib cage.   . Once you reach the bottom right side of your rib cage, take your circular movements across to the left side of the bottom of your rib cage.  . Next, move downward until you reach the inside of your left hip bone.  This is the path your feces travel in your colon. . Continue to perform your abdominal massage in this pattern for 10 minutes each day.     You can apply as much pressure as is comfortable in your massage.  Start gently and build pressure as you continue to practice.  Notice any areas of pain as you massage; areas of slight pain may be relieved as you massage, but if you have areas of significant or intense pain, consult with your healthcare provider.  Other Considerations . General physical activity including bending and stretching can have a beneficial massage-like effect on the colon.  Deep breathing can also stimulate the colon because breathing deeply activates the same nervous system that supplies the colon.   . Abdominal massage should always be used in  combination with a bowel-conscious diet that is high in the proper type of fiber for you, fluids (primarily water), and a regular exercise program.  STRETCHING THE PELVIC FLOOR MUSCLES NO DILATOR  Supplies . Vaginal lubricant . Mirror (optional) . Gloves (optional) Positioning . Start in a semi-reclined position with your head propped up. Bend your knees and place your thumb or finger at the vaginal opening. Procedure . Apply a moderate amount of lubricant on the outer skin of your vagina, the labia minora.  Apply additional lubricant to your finger. Marland Kitchen Spread the skin away from the vaginal opening. Place the end of your finger at the opening. . Do a maximum contraction of the pelvic floor muscles. Tighten the vagina and the anus maximally and relax. . When you know they are relaxed, gently and slowly insert your finger into your vagina, directing your finger slightly downward, for 2-3 inches of insertion. . Relax and stretch the 6 o'clock position . Hold each stretch for _2 min__ and repeat __1_ time with rest breaks of _1__ seconds between each stretch. . Repeat the stretching in the 4 o'clock and 8 o'clock positions. . Total time should be _6__ minutes, _1__ x per day.  Note the amount of theme your were able to achieve and your tolerance to your finger in your vagina. . Once you have accomplished the techniques you may try them in standing with one foot resting on the tub, or in other positions.  This is a good stretch to do in  the shower if you don't need to use lubricant.  Bear Down    Exhaling, bear down as if to have a bowel movement. Repeat _3__ times. Do __2_ times a day.  Copyright  VHI. All rights reserved.  [place] [address], [place], [place] [address] Phone # [phone] Fax [phone]

## 2016-09-16 ENCOUNTER — Encounter: Payer: Self-pay | Admitting: Physical Therapy

## 2016-09-16 ENCOUNTER — Ambulatory Visit: Payer: BC Managed Care – PPO | Admitting: Physical Therapy

## 2016-09-16 DIAGNOSIS — G8929 Other chronic pain: Secondary | ICD-10-CM

## 2016-09-16 DIAGNOSIS — R252 Cramp and spasm: Secondary | ICD-10-CM

## 2016-09-16 DIAGNOSIS — M545 Low back pain: Secondary | ICD-10-CM

## 2016-09-16 DIAGNOSIS — M6281 Muscle weakness (generalized): Secondary | ICD-10-CM

## 2016-09-16 DIAGNOSIS — R279 Unspecified lack of coordination: Secondary | ICD-10-CM

## 2016-09-16 NOTE — Therapy (Signed)
[facility] [address], [place], [place], [address] Phone: [phone]   Fax:  [phone]  Physical Therapy Treatment  Patient Details  Name: Vicki Abbott MRN: [id number] Date of Birth: 1960/11/08 Referring Provider: Dr. Junie Panning carey  Encounter Date: 09/16/2016      Vicki Abbott End of Session - 09/16/16 1237    Visit Number 3   Date for Vicki Abbott Re-Evaluation 12/26/16   Authorization Type BCBS   Vicki Abbott Start Time 1145   Vicki Abbott Stop Time 1230   Vicki Abbott Time Calculation (min) 45 min   Activity Tolerance Patient tolerated treatment well   Behavior During Therapy [facility] for tasks assessed/performed      Past Medical History:  Diagnosis Date  . Anxiety   . PONV (postoperative nausea and vomiting)   . Thyroid disorder 03/16/2012    Past Surgical History:  Procedure Laterality Date  . APPENDECTOMY    . COLONOSCOPY    . DILATION AND CURETTAGE OF UTERUS    . LAPAROSCOPIC APPENDECTOMY  03/15/2012   Procedure: APPENDECTOMY LAPAROSCOPIC;  Surgeon: Zenovia Jarred, MD;  Location: [place];  Service: General;  Laterality: N/A;  . MASS EXCISION Right 11/25/2013   Procedure: BIOPSY MASS RIGHT ELBOW;  Surgeon: Wynonia Sours, MD;  Location: [place];  Service: Orthopedics;  Laterality: Right;  excision of mass antecubital fossa right elbow  . TONSILLECTOMY    . TOTAL ABDOMINAL HYSTERECTOMY  2/15  . TUBAL LIGATION      There were no vitals filed for this visit.      Subjective Assessment - 09/16/16 1150    Subjective Last night I had the bladder frequency.  I had no pain with urinartion.  I am not having the incontinence    Patient Stated Goals reduce abdominal and lower back pain   Currently in Pain? Yes   Pain Score 2    Pain Location Back   Pain Orientation Left;Lower   Pain Descriptors / Indicators Aching   Pain Type Acute pain   Pain Onset More than a month ago   Pain Frequency Constant   Aggravating Factors  bending  and twisting   Pain Relieving Factors heat   Multiple Pain Sites No            [facility] Vicki Abbott Assessment - 09/16/16 0001      Palpation   SI assessment  left ilium is posteriorly rotated                  Pelvic Floor Special Questions - 09/16/16 0001    Pelvic Floor Internal Exam patient confirms identification and approves Vicki Abbott to assess pelvic floor muscle integrity   Exam Type Vaginal   Strength fair squeeze, definite lift  after soft tissue work           [facility] Adult Vicki Abbott Treatment/Exercise - 09/16/16 0001      Manual Therapy   Manual Therapy Muscle Energy Technique;Soft tissue mobilization;Joint mobilization   Joint Mobilization P-A and rotational mobilization to L1-L5 grade 3 in prone   Soft tissue mobilization to left quadratus in prone to release spasm   Internal Pelvic Floor release bilateral sides of the urethra, soft tissue work to the left puborectalis; release of bilateral sides of the bladder; tactle cues to pelvic floor muslces to relax   Muscle Energy Technique correct posteriorly rotated ilium                Vicki Abbott Education - 09/16/16  1234    Education provided Yes   Education Details correct pelvis; pelvic floor contraction and relaxation   Person(s) Educated Patient   Methods Explanation;Demonstration;Verbal cues;Handout   Comprehension Verbalized understanding;Returned demonstration          Vicki Abbott Short Term Goals - 09/16/16 1154      Vicki Abbott SHORT TERM GOAL #1   Title independent with perineal massage   Time 4   Period Weeks   Status Achieved     Vicki Abbott SHORT TERM GOAL #2   Title independent with flexibility program   Time 4   Period Weeks   Status Achieved     Vicki Abbott SHORT TERM GOAL #3   Title lower abdominal pain is intermittent and 25% better at night   Time 4   Period Weeks   Status Achieved     Vicki Abbott SHORT TERM GOAL #4   Title lumbar pain decreased >/= 25% with squatting and sitting   Period Weeks   Status Achieved           Vicki Abbott  Long Term Goals - 08/26/16 1425      Vicki Abbott LONG TERM GOAL #1   Title independent with HEP and what she should focus on with her personal trainer   Time 4   Period Months   Status New     Vicki Abbott LONG TERM GOAL #2   Title lower abdominal pain decreased >/= 75% at night   Time 4   Period Months   Status New     Vicki Abbott LONG TERM GOAL #3   Title lower back pain decreased >/= 75% with squatting, sitting, and turning   Time 4   Period Months   Status New     Vicki Abbott LONG TERM GOAL #4   Title pelvic floor strength increases to 4/5 due to decreased muscle spasms and ability to bulge the pelvic floor   Time 4   Period Months   Status New     Vicki Abbott LONG TERM GOAL #5   Title ability to perfrom daily activities with >/=75% greater ease due to decreased pain   Time 4   Period Months               Plan - 09/16/16 1237    Clinical Impression Statement Lower abdominal pain had decreased by 25%. Patient needed tactile cues to relax the pelvic floor muscles after contraction. She is having difficulty with it.  After soft tissue work patient had increased in pelvic floor contraction to 3/5. Patient had less trigger points in the pelvic floor muscles compared to last visit.  Patient reports decreased pain after therapy.  Patient will benefit from skilled therapy to reduce pain and improve function.    Rehab Potential Excellent   Clinical Impairments Affecting Rehab Potential None   Vicki Abbott Frequency 1x / week   Vicki Abbott Duration Other (comment)  4 months   Vicki Abbott Treatment/Interventions Biofeedback;Cryotherapy;Electrical Stimulation;Iontophoresis 4mg /ml Dexamethasone;Moist Heat;Traction;Ultrasound;Patient/family education;Neuromuscular re-education;Balance training;Therapeutic exercise;Therapeutic activities;Manual techniques;Passive range of motion;Dry needling;Energy conservation;Taping   Vicki Abbott Next Visit Plan  , internal soft tissue work around the bladder, perineal bulging; stretches   Vicki Abbott Home Exercise Plan progress as  needed   Consulted and Agree with Plan of Care Patient      Patient will benefit from skilled therapeutic intervention in order to improve the following deficits and impairments:  Decreased range of motion, Increased fascial restricitons, Increased muscle spasms, Pain, Decreased strength, Decreased mobility  Visit Diagnosis: Muscle weakness (generalized)  Chronic bilateral low  back pain without sciatica  Cramp and spasm  Unspecified lack of coordination     Problem List Patient Active Problem List   Diagnosis Date Noted  . Precordial chest pain 10/01/2014  . Appendicitis, acute 03/16/2012  . Thyroid disorder 03/16/2012    Vicki Abbott, Vicki Abbott 09/16/16 12:42 PM    [facility] [address], [place], [place], [address] Phone: [phone]   Fax:  [phone]  Name: Vicki Abbott MRN: [id number] Date of Birth: 18-Nov-1960

## 2016-09-16 NOTE — Patient Instructions (Addendum)
Pelvic Rotation: Contract / Relax (Supine)    Hands against left knee, resist bent leg moving toward head. Press straight leg down. Hold _6___ seconds. Relax. Repeat __3__ times per set. Do _1___ sets per session. Do _1___ sessions per day.  http://orth.exer.us/276   Copyright  VHI. All rights reserved.  Slow Contraction: Gravity Eliminated (Hook-Lying)    Lie with hips and knees bent. Slowly squeeze pelvic floor for _5__ seconds.Bulge the pelvic floor for 1 sec Repeat _10__ times. Do 2-3 ___ times a day.   Copyright  VHI. All rights reserved.  [facility] [address] [place], [place] [address] Phone # [phone] Fax [phone]

## 2016-09-23 ENCOUNTER — Encounter: Payer: Self-pay | Admitting: Physical Therapy

## 2016-09-23 ENCOUNTER — Ambulatory Visit: Payer: BC Managed Care – PPO | Admitting: Physical Therapy

## 2016-09-23 DIAGNOSIS — G8929 Other chronic pain: Secondary | ICD-10-CM

## 2016-09-23 DIAGNOSIS — M6281 Muscle weakness (generalized): Secondary | ICD-10-CM

## 2016-09-23 DIAGNOSIS — R279 Unspecified lack of coordination: Secondary | ICD-10-CM

## 2016-09-23 DIAGNOSIS — R252 Cramp and spasm: Secondary | ICD-10-CM

## 2016-09-23 DIAGNOSIS — M545 Low back pain: Secondary | ICD-10-CM

## 2016-09-23 NOTE — Therapy (Addendum)
[facility] [address] [place], [place], [address] Phone: [phone]   Fax:  [phone]  Physical Therapy Treatment  Patient Details  Name: Vicki Abbott MRN: [id number] Date of Birth: 02-28-1961 Referring Provider: Dr. Junie Panning carey  Encounter Date: 09/23/2016      PT End of Session - 09/23/16 1152    Visit Number 4   Date for PT Re-Evaluation 12/26/16   Authorization Type BCBS   PT Start Time 1145   PT Stop Time 1225   PT Time Calculation (min) 40 min   Activity Tolerance Patient tolerated treatment well   Behavior During Therapy [facility] for tasks assessed/performed      Past Medical History:  Diagnosis Date  . Anxiety   . PONV (postoperative nausea and vomiting)   . Thyroid disorder 03/16/2012    Past Surgical History:  Procedure Laterality Date  . APPENDECTOMY    . COLONOSCOPY    . DILATION AND CURETTAGE OF UTERUS    . LAPAROSCOPIC APPENDECTOMY  03/15/2012   Procedure: APPENDECTOMY LAPAROSCOPIC;  Surgeon: Zenovia Jarred, MD;  Location: [place];  Service: General;  Laterality: N/A;  . MASS EXCISION Right 11/25/2013   Procedure: BIOPSY MASS RIGHT ELBOW;  Surgeon: Wynonia Sours, MD;  Location: [place];  Service: Orthopedics;  Laterality: Right;  excision of mass antecubital fossa right elbow  . TONSILLECTOMY    . TOTAL ABDOMINAL HYSTERECTOMY  2/15  . TUBAL LIGATION      There were no vitals filed for this visit.      Subjective Assessment - 09/23/16 1151    Subjective I had increased pain this weekend and urgency at night in the lower abdominal area.    Patient Stated Goals reduce abdominal and lower back pain   Currently in Pain? Yes   Pain Score 4    Pain Location Abdomen   Pain Orientation Lower   Pain Descriptors / Indicators Aching;Throbbing   Pain Type Acute pain   Pain Onset More than a month ago   Pain Frequency Intermittent   Aggravating Factors  not sure   Pain  Relieving Factors heat   Multiple Pain Sites No            [facility] PT Assessment - 09/23/16 0001      Palpation   SI assessment  left ilium is posteriorly rotated; sacrum rotated left                     [facility] Adult PT Treatment/Exercise - 09/23/16 0001      Self-Care   Self-Care Other Self-Care Comments   Other Self-Care Comments  Discussed on going back on the Welbutrin to decrease bladder spasms     Neuro Re-ed    Neuro Re-ed Details  urge to void during her sleep to reduce the bladder spasms and the amount of time she has to urinate during the night     Exercises   Exercises Other Exercises   Other Exercises  foam rolling for the iliotibial band, fip flexor, lower abdomen, piriformis and back     Manual Therapy   Manual Therapy Joint mobilization;Muscle Energy Technique   Manual therapy comments manually stretched bil. hip flexors   Joint Mobilization correct rotated left sacrum; L5 rotated left correct with joint mobilization grade 3; anterior bil. hip mobilization grade 3;    Muscle Energy Technique correct posteriorly rotated ilium  PT Education - 09/23/16 1223    Education provided Yes   Education Details foam roll, urge to void   Person(s) Educated Patient   Methods Explanation;Demonstration;Verbal cues;Handout   Comprehension Returned demonstration;Verbalized understanding          PT Short Term Goals - 09/16/16 1154      PT SHORT TERM GOAL #1   Title independent with perineal massage   Time 4   Period Weeks   Status Achieved     PT SHORT TERM GOAL #2   Title independent with flexibility program   Time 4   Period Weeks   Status Achieved     PT SHORT TERM GOAL #3   Title lower abdominal pain is intermittent and 25% better at night   Time 4   Period Weeks   Status Achieved     PT SHORT TERM GOAL #4   Title lumbar pain decreased >/= 25% with squatting and sitting   Period Weeks   Status Achieved            PT Long Term Goals - 09/23/16 1153      PT LONG TERM GOAL #1   Title independent with HEP and what she should focus on with her personal trainer   Period Months   Status On-going     PT LONG TERM GOAL #2   Title lower abdominal pain decreased >/= 75% at night   Time 4   Period Months   Status On-going  more pain this week     PT LONG TERM GOAL #3   Title lower back pain decreased >/= 75% with squatting, sitting, and turning   Time 4   Period Months   Status On-going     PT LONG TERM GOAL #4   Title pelvic floor strength increases to 4/5 due to decreased muscle spasms and ability to bulge the pelvic floor   Time 4   Period Months   Status On-going     PT LONG TERM GOAL #5   Title ability to perfrom daily activities with >/=75% greater ease due to decreased pain   Time 4   Period Months   Status On-going               Plan - 09/23/16 1229    Clinical Impression Statement Patient pain has not changed since last visit.  She is having more spasms during the night making her have to urinate many times.  Patient has not met goals at this time.  Patient pelvis in correct alignment after therapy.  Patient has tight hip flexors and anterior hip joint.  Paitent understands how to do the reduce urge exercise. Patient will benefit from skilled therapy to reduce bladder spasms and improve function.    Rehab Potential Excellent   Clinical Impairments Affecting Rehab Potential None   PT Frequency 1x / week   PT Duration Other (comment)  4 months   PT Treatment/Interventions Biofeedback;Cryotherapy;Electrical Stimulation;Iontophoresis 67m/ml Dexamethasone;Moist Heat;Traction;Ultrasound;Patient/family education;Neuromuscular re-education;Balance training;Therapeutic exercise;Therapeutic activities;Manual techniques;Passive range of motion;Dry needling;Energy conservation;Taping   PT Next Visit Plan  , internal soft tissue work around the bladder, perineal bulging; stretches   PT  Home Exercise Plan progress as needed   Consulted and Agree with Plan of Care Patient      Patient will benefit from skilled therapeutic intervention in order to improve the following deficits and impairments:  Decreased range of motion, Increased fascial restricitons, Increased muscle spasms, Pain, Decreased strength, Decreased mobility  Visit  Diagnosis: Muscle weakness (generalized)  Chronic bilateral low back pain without sciatica  Cramp and spasm  Unspecified lack of coordination     Problem List Patient Active Problem List   Diagnosis Date Noted  . Precordial chest pain 10/01/2014  . Appendicitis, acute 03/16/2012  . Thyroid disorder 03/16/2012    Earlie Counts, PT 09/23/16 12:32 PM   [facility] [address], [place], [place], [address] Phone: [phone]   Fax:  [phone]  Name: Vicki Abbott MRN: [id number] Date of Birth: 01/23/1961  PHYSICAL THERAPY DISCHARGE SUMMARY  Visits from Start of Care: 4  Current functional level related to goals / functional outcomes: See above.  Unable to assess patient due to not returning since last visit on 09/23/2016.     Remaining deficits: See above   Education / Equipment: HEP Plan:                                                    Patient goals were not met. Patient is being discharged due to not returning since the last visit.  Thank you for the referral. Earlie Counts, PT 11/06/16 1:00 PM  ?????

## 2016-09-23 NOTE — Patient Instructions (Signed)
   Place foam roll under hip flexor (top of thigh). Roll forward and backward.    Cross the affected leg over your unaffected leg as pictured. Sit on a foam roller and lean toward your affected side. Pull yourself forward and back over your buttock. This should be sore. Roll 10-15 times over this area.    Start: Position yourself with the side of your leg on the foam roll as pictured.  Movement: Lift your hips off of the floor and proceed to roll the side of your leg on the foam roll.  Relaxation Exercises with the Urge to Void   When you experience an urge to void:  FIRST  Stop and lay very still    Keep hands laying over lower abdomen.    Don't move    You need to stay very still to maintain control  SECOND Squeeze your pelvic floor muscles 5 times, like a quick flick, to keep from leaking  THIRD Relax  Take a deep breath by filling your stomach up  and then let it out slowly.  Focus on your breath.  Try to make the urge go away by using relaxation and visualization techniques  FINALLY When you feel the urge go away go to sleep.   If the urge gets suddenly stronger on the way, you may stop again and relax to regain control.   Begin laying face up on a solid surface. Bend knees and hips to place feet on floor or on a wall at 90 degrees. Place the roller underneath your mid back below the shoulder blades and place hands behind neck for support. Tighten abdominals and close the ribcage to stabilize lumbar/low back. Maintaining abdominal contraction, let gravity begin to pull your shoulders down behind the roller. Breathe normally.  [facility] [address], [place], [place] [address] Phone # [phone] Fax [phone]  Contract abdominals to stabilize low back to come back to sitting position.

## 2016-09-30 ENCOUNTER — Encounter: Payer: BC Managed Care – PPO | Admitting: Physical Therapy

## 2016-10-07 ENCOUNTER — Encounter: Payer: BC Managed Care – PPO | Admitting: Physical Therapy

## 2016-10-14 ENCOUNTER — Encounter: Payer: BC Managed Care – PPO | Admitting: Physical Therapy

## 2016-10-16 ENCOUNTER — Encounter: Payer: BC Managed Care – PPO | Admitting: Physical Therapy

## 2017-06-11 ENCOUNTER — Ambulatory Visit: Payer: BC Managed Care – PPO | Attending: Obstetrics and Gynecology | Admitting: Physical Therapy

## 2017-06-11 ENCOUNTER — Encounter: Payer: Self-pay | Admitting: Physical Therapy

## 2017-06-11 DIAGNOSIS — M6281 Muscle weakness (generalized): Secondary | ICD-10-CM | POA: Diagnosis not present

## 2017-06-11 DIAGNOSIS — R252 Cramp and spasm: Secondary | ICD-10-CM

## 2017-06-11 NOTE — Therapy (Signed)
[facility] [address] [place], [place], [address] Phone: [phone]   Fax:  [phone]  Physical Therapy Evaluation  Patient Details  Name: Vicki Abbott MRN: [id number] Date of Birth: 1961-01-22 Referring Provider: Dr. Bjorn Pippin   Encounter Date: 06/11/2017  PT End of Session - 06/11/17 1614    Visit Number  1    Date for PT Re-Evaluation  06/25/17    Authorization Type  BCBS    PT Start Time  1530    PT Stop Time  1610    PT Time Calculation (min)  40 min    Activity Tolerance  Patient tolerated treatment well    Behavior During Therapy  [facility] for tasks assessed/performed       Past Medical History:  Diagnosis Date  . Anxiety   . PONV (postoperative nausea and vomiting)   . Thyroid disorder 03/16/2012    Past Surgical History:  Procedure Laterality Date  . APPENDECTOMY    . COLONOSCOPY    . DILATION AND CURETTAGE OF UTERUS    . LAPAROSCOPIC APPENDECTOMY  03/15/2012   Procedure: APPENDECTOMY LAPAROSCOPIC;  Surgeon: Zenovia Jarred, MD;  Location: [place];  Service: General;  Laterality: N/A;  . MASS EXCISION Right 11/25/2013   Procedure: BIOPSY MASS RIGHT ELBOW;  Surgeon: Wynonia Sours, MD;  Location: [place];  Service: Orthopedics;  Laterality: Right;  excision of mass antecubital fossa right elbow  . TONSILLECTOMY    . TOTAL ABDOMINAL HYSTERECTOMY  2/15  . TUBAL LIGATION      There were no vitals filed for this visit.   Subjective Assessment - 06/11/17 1535    Subjective  Two weeks ago I had a flare-up. They did an instillation and put a shot into my right hip.  I started to have bladder spasms. I am leaving next friday for one month. Pain woke her up at night.     Patient Stated Goals  reduce pain    Currently in Pain?  Yes    Pain Score  2     Pain Location  Pelvis    Pain Orientation  Mid    Pain Descriptors / Indicators  Stabbing;Aching menstrual cramp    Pain  Type  Acute pain    Pain Onset  More than a month ago    Pain Frequency  Intermittent    Aggravating Factors   sexual intercourse    Pain Relieving Factors  no sex    Multiple Pain Sites  No         [facility] PT Assessment - 06/11/17 0001      Assessment   Medical Diagnosis  M62.89 Pelvic floor dysfunction    Referring Provider  Dr. Bjorn Pippin    Onset Date/Surgical Date  05/28/17    Prior Therapy  yes      Precautions   Precautions  None      Restrictions   Weight Bearing Restrictions  No      Balance Screen   Has the patient fallen in the past 6 months  No    Has the patient had a decrease in activity level because of a fear of falling?   No    Is the patient reluctant to leave their home because of a fear of falling?   No      Home Film/video editor residence      Prior Function  Level of Independence  Independent    Vocation  Retired    Leisure  [employer] with Chief Financial Officer   Overall Cognitive Status  Within Functional Limits for tasks assessed      Posture/Postural Control   Posture/Postural Control  No significant limitations      ROM / Strength   AROM / PROM / Strength  AROM;PROM;Strength      AROM   Lumbar Flexion  decreased by 25%    Lumbar Extension  decreased by 25%      Palpation   SI assessment   pelvis in correct alignment    Palpation comment  tenerness located lower abdomen, tightness located in upper upper abdomen             Objective measurements completed on examination: See above findings.    Pelvic Floor Special Questions - 06/11/17 0001    Urinary Leakage  No    Skin Integrity  Intact dry    Pelvic Floor Internal Exam  Patient confirms identification and approves PT to assess muscle integrity and treatment    Exam Type  Vaginal    Palpation  tenderness located on bilateral urethral sphincter and around the bladder; tightness in the lower abdominal tissue externally    Strength   fair squeeze, definite lift               PT Education - 06/11/17 1612    Education provided  Yes    Education Details  education on vaginal moisturizers and how to use them; gave patient samples of [facility]) Educated  Patient    Methods  Explanation;Demonstration;Verbal cues    Comprehension  Returned demonstration;Verbalized understanding          PT Long Term Goals - 06/11/17 1621      PT LONG TERM GOAL #1   Title  understand how to use vaginal moisturizers and lubricants for good vaginal health    Time  2    Period  Weeks    Status  New    Target Date  06/25/17      PT LONG TERM GOAL #2   Title  bladder pain decreased >/= 50% due to improve fascial mobility     Time  2    Period  Weeks    Status  New    Target Date  06/25/17      PT LONG TERM GOAL #3   Title  decreased itching and burning in the vaginal area due to using a cream for the dryness that is appropriate    Time  2    Period  Weeks    Status  New    Target Date  06/25/17      PT LONG TERM GOAL #4   Title  --------      PT LONG TERM GOAL #5   Title  --------             Plan - 06/11/17 1614    Clinical Impression Statement  Patient is a 57 year old female with pelvic pain in the lower abdoment and around the urethra that began suddenly 2 weeks ago. Patient has had a bladder instillation and shot to right hip in the past 2 weeks.  Patient reports her pain is intremittent at level 2/10 and is worse with intercourse. Patient has dryness in the labial minora and majora with itching and burning.  Tenderness located in lower  abdominal area with tightness in upper abdominal and lower abdominal muscles.  Tenderness and tightness located in bilateral urethra sphincter and around the bladder and urethra.  Pelvic floor strength is 3/5.  Patient will be leaving for a month next Friday.  Patient will benefit from skilled therapy to reduce her pain and improve tissue mobility to  assist in function    History and Personal Factors relevant to plan of care:  hysterectomy    Clinical Presentation  Stable    Clinical Presentation due to:  stable condition    Clinical Decision Making  Low    Rehab Potential  Excellent    Clinical Impairments Affecting Rehab Potential  hysterectomy    PT Frequency  Other (comment) 3 visits     PT Duration  2 weeks    PT Treatment/Interventions  Therapeutic exercise;Therapeutic activities;Neuromuscular re-education;Patient/family education;Manual techniques    PT Next Visit Plan  abdominal soft tissue work, review info on vaginal moisturizers and add about lubricants; stretches    PT Home Exercise Plan  see above    Consulted and Agree with Plan of Care  Patient       Patient will benefit from skilled therapeutic intervention in order to improve the following deficits and impairments:  Pain, Increased fascial restricitons, Increased muscle spasms, Decreased strength  Visit Diagnosis: Muscle weakness (generalized) - Plan: PT plan of care cert/re-cert  Cramp and spasm - Plan: PT plan of care cert/re-cert     Problem List Patient Active Problem List   Diagnosis Date Noted  . Precordial chest pain 10/01/2014  . Appendicitis, acute 03/16/2012  . Thyroid disorder 03/16/2012    Earlie Counts, PT 06/11/17 4:26 PM   [facility] [address], [place], [place], [address] Phone: [phone]   Fax:  [phone]  Name: Vicki Abbott MRN: [id number] Date of Birth: 01/20/1961

## 2017-06-11 NOTE — Patient Instructions (Signed)
Moisturizers . They are used in the vagina to hydrate the mucous membrane that make up the vaginal canal. . Designed to keep a more normal acid balance (ph) . Once placed in the vagina, it will last between two to three days.  . Use 2-3 times per week at bedtime and last longer than 60 min. . Ingredients to avoid is glycerin and fragrance, can increase chance of infection . Should not be used just before sex due to causing irritation . Most are gels administered either in a tampon-shaped applicator or as a vaginal suppository. They are non-hormonal.   Types of Moisturizers . Samul Dada- drug store . Vitamin E vaginal suppositories- Whole foods, Amazon . Moist Again . Coconut oil- can break down condoms . Michail Jewels . Yes moisturizer- amazon . NeuEve Silk , NeuEve Silver for menopausal or over 65 (if have severe vaginal atrophy or cancer treatments use NeuEve Silk for  1 month than move to [employer])- [employer], MapleFlower.dk . Olive and Bee intimate cream- www.oliveandbee.com.au  Creams to use externally on the Vulva area  Albertson's (good for for cancer patients that had radiation to the area)- [place] or [employer] Corporation.FlyingBasics.com.br  V-magic cream - amazon  Julva-amazon  Vital "V Wild Yam salve ( help moisturize and help with thinning vulvar area, does have Pillow   Things to avoid in the vaginal area . Do not use things to irritate the vulvar area . No lotions just specialized creams for the vulva area- Neogyn, V-magic, No soaps; can use Aveeno or Calendula cleanser if needed. Must be gentle . No deodorants . No douches . Good to sleep without underwear to let the vaginal area to air out . No scrubbing: spread the lips to let warm water rinse over labias and pat dry [facility] [address] [place], [place] [address] Phone # [phone] Fax [phone]

## 2017-06-16 ENCOUNTER — Encounter: Payer: Self-pay | Admitting: Physical Therapy

## 2017-06-16 ENCOUNTER — Ambulatory Visit: Payer: BC Managed Care – PPO | Admitting: Physical Therapy

## 2017-06-16 DIAGNOSIS — R252 Cramp and spasm: Secondary | ICD-10-CM

## 2017-06-16 DIAGNOSIS — M6281 Muscle weakness (generalized): Secondary | ICD-10-CM

## 2017-06-16 NOTE — Therapy (Signed)
[facility] [address], [place], [place], [address] Phone: [phone]   Fax:  [phone]  Physical Therapy Treatment  Patient Details  Name: Vicki Abbott MRN: [id number] Date of Birth: [birth year] Referring Provider: Dr. Bjorn Pippin   Encounter Date: 06/16/2017  PT End of Session - 06/16/17 1444    Visit Number  2    Date for PT Re-Evaluation  06/25/17    Authorization Type  BCBS    PT Start Time  1400    PT Stop Time  1445    PT Time Calculation (min)  45 min    Activity Tolerance  Patient tolerated treatment well    Behavior During Therapy  [facility] for tasks assessed/performed       Past Medical History:  Diagnosis Date  . Anxiety   . PONV (postoperative nausea and vomiting)   . Thyroid disorder 03/16/2012    Past Surgical History:  Procedure Laterality Date  . APPENDECTOMY    . COLONOSCOPY    . DILATION AND CURETTAGE OF UTERUS    . LAPAROSCOPIC APPENDECTOMY  03/15/2012   Procedure: APPENDECTOMY LAPAROSCOPIC;  Surgeon: Zenovia Jarred, MD;  Location: [place];  Service: General;  Laterality: N/A;  . MASS EXCISION Right 11/25/2013   Procedure: BIOPSY MASS RIGHT ELBOW;  Surgeon: Wynonia Sours, MD;  Location: [place];  Service: Orthopedics;  Laterality: Right;  excision of mass antecubital fossa right elbow  . TONSILLECTOMY    . TOTAL ABDOMINAL HYSTERECTOMY  2/15  . TUBAL LIGATION      There were no vitals filed for this visit.  Subjective Assessment - 06/16/17 1404    Subjective  I felt good after last visit.  I have not had the urgency with pain at night.  I leave this friday. Tenderness located in the lower abdominal area. I tried the Aloe in the packets and it helped. I see the doctor in March for a follow up.     Patient Stated Goals  reduce pain    Currently in Pain?  No/denies         [facility] PT Assessment - 06/16/17 0001      Assessment   Medical Diagnosis  M62.89  Pelvic floor dysfunction    Referring Provider  Dr. Bjorn Pippin    Onset Date/Surgical Date  05/28/17    Prior Therapy  yes      Precautions   Precautions  None      Restrictions   Weight Bearing Restrictions  No      Home Environment   Living Environment  Private residence      Prior Function   Level of Independence  Independent    Vocation  Retired    Leisure  [employer] with personal trainer      Cognition   Overall Cognitive Status  Within Functional Limits for tasks assessed      Posture/Postural Control   Posture/Postural Control  No significant limitations      Palpation   SI assessment   pelvis in correct alignment    Palpation comment  tenerness located lower abdomen, tightness located in upper upper abdomen               Pelvic Floor Special Questions - 06/16/17 0001    Urinary Leakage  No    Skin Integrity  Intact dry    Pelvic Floor Internal Exam  Patient confirms identification and approves PT to assess muscle  integrity and treatment    Exam Type  Vaginal    Palpation  tenderness located on bilateral urethral sphincter and around the bladder; tightness in the lower abdominal tissue externally    Strength  fair squeeze, definite lift        [facility] Adult PT Treatment/Exercise - 06/16/17 0001      Manual Therapy   Manual Therapy  Soft tissue mobilization;Internal Pelvic Floor;Myofascial release    Soft tissue mobilization  diaphgram, upper abdomen to increase tissue mobility    Myofascial Release  release through the 3 planes of fascia in lower abdoment and around the umbilicus    Internal Pelvic Floor  release of bil. side of the bladder, soft tissue work to bil. urethra sphincter             PT Education - 06/16/17 1441    Education provided  Yes    Education Details  pelvic floor contraction in siting     Person(s) Educated  Patient    Methods  Explanation;Demonstration;Verbal cues;Handout    Comprehension  Returned  demonstration;Verbalized understanding          PT Long Term Goals - 06/16/17 1446      PT LONG TERM GOAL #1   Title  understand how to use vaginal moisturizers and lubricants for good vaginal health    Time  2    Period  Weeks    Status  Achieved      PT LONG TERM GOAL #2   Title  bladder pain decreased >/= 50% due to improve fascial mobility     Time  2    Period  Weeks    Status  Achieved      PT LONG TERM GOAL #3   Title  decreased itching and burning in the vaginal area due to using a cream for the dryness that is appropriate    Time  2    Period  Weeks    Status  Achieved            Plan - 06/16/17 1444    Clinical Impression Statement  Patient has met all of her goals.  Patient has improved pelvic floor strength to 3/5.  Patient has improve tissue mobility of the upper abdominal muscles and lower.  Patient has decreased urethral pain.  Patient will be leaving for 1 month so she is ready for discharge and happy with her progress.     Rehab Potential  Excellent    Clinical Impairments Affecting Rehab Potential  hysterectomy    PT Treatment/Interventions  Therapeutic exercise;Therapeutic activities;Neuromuscular re-education;Patient/family education;Manual techniques    PT Next Visit Plan  discharge to HEP today    PT Home Exercise Plan  current HEP    Recommended Other Services  MD has not signed the initial summary yet    Consulted and Agree with Plan of Care  Patient       Patient will benefit from skilled therapeutic intervention in order to improve the following deficits and impairments:  Pain, Increased fascial restricitons, Increased muscle spasms, Decreased strength  Visit Diagnosis: Muscle weakness (generalized)  Cramp and spasm     Problem List Patient Active Problem List   Diagnosis Date Noted  . Precordial chest pain 10/01/2014  . Appendicitis, acute 03/16/2012  . Thyroid disorder 03/16/2012    Earlie Counts, PT 06/16/17 2:53 PM   [facility] [address] [place], [place], [address] Phone: [phone]  Fax:  [phone]  Name: Vicki Abbott MRN: [id number]   PHYSICAL THERAPY DISCHARGE SUMMARY  Visits from Start of Care: 2  Current functional level related to goals / functional outcomes: See above   Remaining deficits: See above.    Education / Equipment: HEP Plan: Patient agrees to discharge.  Patient goals were met. Patient is being discharged due to meeting the stated rehab goals.  Thank you for the referral. Earlie Counts, PT 06/16/17 2:53 PM  ?????      Date of Birth: Jan 16, 1961

## 2017-06-16 NOTE — Patient Instructions (Addendum)
Quick Contraction: Gravity Resisted (Sitting)    Sitting, quickly squeeze then fully relax pelvic floor. Perform __1_ sets of _5__. Rest for _1__ seconds between sets. Do _3__ times a day.  Copyright  VHI. All rights reserved.  Slow Contraction: Gravity Resisted (Sitting)    Sitting, slowly squeeze pelvic floor for __5_ seconds. Rest for __5_ seconds. Repeat __5_ times. Do _3__ times a day.  Copyright  VHI. All rights reserved.  Vicki Abbott [address], [place], [place] [address] Phone # [phone] Fax [phone]

## 2017-06-18 ENCOUNTER — Encounter: Payer: BC Managed Care – PPO | Admitting: Physical Therapy

## 2017-07-29 ENCOUNTER — Other Ambulatory Visit: Payer: Self-pay | Admitting: Obstetrics & Gynecology

## 2017-07-29 DIAGNOSIS — R928 Other abnormal and inconclusive findings on diagnostic imaging of breast: Secondary | ICD-10-CM

## 2017-08-10 ENCOUNTER — Ambulatory Visit: Payer: BC Managed Care – PPO

## 2017-08-10 ENCOUNTER — Ambulatory Visit
Admission: RE | Admit: 2017-08-10 | Discharge: 2017-08-10 | Disposition: A | Discharge status: Home / Self Care | Payer: BC Managed Care – PPO | Source: Ambulatory Visit | Attending: Obstetrics & Gynecology | Admitting: Obstetrics & Gynecology

## 2017-08-10 DIAGNOSIS — R928 Other abnormal and inconclusive findings on diagnostic imaging of breast: Secondary | ICD-10-CM

## 2018-01-27 ENCOUNTER — Encounter: Payer: Self-pay | Admitting: Physical Therapy

## 2018-01-27 ENCOUNTER — Other Ambulatory Visit: Payer: Self-pay

## 2018-01-27 ENCOUNTER — Ambulatory Visit: Payer: BC Managed Care – PPO | Attending: Obstetrics and Gynecology | Admitting: Physical Therapy

## 2018-01-27 DIAGNOSIS — R252 Cramp and spasm: Secondary | ICD-10-CM | POA: Diagnosis not present

## 2018-01-27 NOTE — Therapy (Signed)
[facility] [address] [place], [place], [address] Phone: [phone]   Fax:  [phone]  Physical Therapy Evaluation  Patient Details  Name: Vicki Abbott MRN: [id number] Date of Birth: Mar 09, 1961 Referring Provider: Dr. Bjorn Pippin   Encounter Date: 01/27/2018  PT End of Session - 01/27/18 1152    Visit Number  1    Date for PT Re-Evaluation  03/10/18    Authorization Type  BCBS    PT Start Time  1015    PT Stop Time  1055    PT Time Calculation (min)  40 min    Activity Tolerance  Treatment limited secondary to medical complications (Comment)    Behavior During Therapy  [facility] for tasks assessed/performed       Past Medical History:  Diagnosis Date  . Anxiety   . PONV (postoperative nausea and vomiting)   . Thyroid disorder 03/16/2012    Past Surgical History:  Procedure Laterality Date  . APPENDECTOMY    . COLONOSCOPY    . DILATION AND CURETTAGE OF UTERUS    . LAPAROSCOPIC APPENDECTOMY  03/15/2012   Procedure: APPENDECTOMY LAPAROSCOPIC;  Surgeon: Zenovia Jarred, MD;  Location: [place];  Service: General;  Laterality: N/A;  . MASS EXCISION Right 11/25/2013   Procedure: BIOPSY MASS RIGHT ELBOW;  Surgeon: Wynonia Sours, MD;  Location: [place];  Service: Orthopedics;  Laterality: Right;  excision of mass antecubital fossa right elbow  . TONSILLECTOMY    . TOTAL ABDOMINAL HYSTERECTOMY  2/15  . TUBAL LIGATION      There were no vitals filed for this visit.   Subjective Assessment - 01/27/18 1023    Subjective  Just started antibiotic on 01/25/2018.  Pelvic pain started last wednesday wiht going to the bathroom regularly and felt a sting. Over the weekend I had back pain.     Patient Stated Goals  reduce pain    Currently in Pain?  Yes    Pain Score  3     Pain Location  Pelvis    Pain Orientation  Lower;Mid    Pain Descriptors / Indicators  Cramping    Pain Type  Acute pain     Pain Onset  In the past 7 days    Pain Frequency  Intermittent    Aggravating Factors   urination    Pain Relieving Factors  heat         [facility] PT Assessment - 01/27/18 0001      Assessment   Medical Diagnosis  M62.89 Pelvic floor dysfunction    Referring Provider  Dr. Bjorn Pippin    Onset Date/Surgical Date  01/20/18    Prior Therapy  yes in the past      Precautions   Precautions  None      Restrictions   Weight Bearing Restrictions  No      Balance Screen   Has the patient fallen in the past 6 months  No    Has the patient had a decrease in activity level because of a fear of falling?   No    Is the patient reluctant to leave their home because of a fear of falling?   No      Prior Function   Level of Independence  Independent    Leisure  workout 2 times per week      Cognition   Overall Cognitive Status  Within Functional Limits for tasks  assessed      Posture/Postural Control   Posture/Postural Control  No significant limitations      ROM / Strength   AROM / PROM / Strength  AROM;PROM;Strength      AROM   Lumbar Flexion  decreased by 25%    Lumbar Extension  decreased by 50%    Lumbar - Right Side Bend  decreased by 50%    Lumbar - Left Side Bend  decreased by 50%      Palpation   Spinal mobility  decreased mobility of right side of L1-L5    SI assessment   sacrum rotated right; right ilium is anteriorly rotated    Palpation comment  tenderness located on the right quadratus; right and left lower quadrant                Objective measurements completed on examination: See above findings.    Pelvic Floor Special Questions - 01/27/18 0001    Urinary Leakage  Yes    How often  since she has had to infection    External Perineal Exam  tenderness to left levator ani by the left pubic rami    Pelvic Floor Internal Exam  not assessed internally due to current infection       [facility] Adult PT Treatment/Exercise - 01/27/18 0001      Manual  Therapy   Manual Therapy  Soft tissue mobilization;Myofascial release;Joint mobilization    Joint Mobilization  L1-L5 rotational mobilization grade 3; scaral mobilization to correct right rotation    Soft tissue mobilization  left levator ani by the pubic rame    Myofascial Release  lower abdomen to release the fascia and improve pain               PT Short Term Goals - 01/27/18 1200      PT SHORT TERM GOAL #1   Title  pelvic pain with daily activites decreased >/= 25%    Time  3    Period  Weeks    Status  New    Target Date  02/17/18      PT SHORT TERM GOAL #2   Title  independent with flexibility program    Time  3    Period  Weeks    Status  New    Target Date  02/17/18      PT SHORT TERM GOAL #3   Title  -----      PT SHORT TERM GOAL #4   Title  -----        PT Long Term Goals - 01/27/18 1201      PT LONG TERM GOAL #1   Title  independent with HEP    Time  6    Period  Weeks    Status  New    Target Date  03/10/18      PT LONG TERM GOAL #2   Title  lower abdominal pain with activity decreased >/= 75% due to improved tissue mobility    Time  6    Period  Weeks    Status  New    Target Date  03/10/18      PT LONG TERM GOAL #3   Title  pain after urination decreased >/= 75%    Time  6    Period  Weeks    Status  New    Target Date  03/10/18             Plan - 01/27/18  73    Clinical Impression Statement  Patient is a 57 year old female with pelvic pain since she has had an infection.  Patient is presently taking antibiotic, Macrobed. Patient reports her pain level is 3/10 in lower abdominal and she had back pain a few days ago.  Tenderness located in right quadratus and left levator ani by the pubic rami.  Right ilium is rotated anteriorly.  Sacrum rotated to the right. L1-L5 has decreased mobiltiy.  Patient will benefit from skilled therapy to reduce pain and improve joint and tissue mobility.     History and Personal Factors relevant to  plan of care:  Appendectomy; Abdominal hysterectomy 2/15    Clinical Presentation  Evolving    Clinical Presentation due to:  stable condition    Clinical Decision Making  Low    Rehab Potential  Excellent    Clinical Impairments Affecting Rehab Potential  Appendectomy; Abdominal hysterectomy 2/15    PT Frequency  1x / week    PT Duration  6 weeks    PT Treatment/Interventions  Cryotherapy;Electrical Stimulation;Moist Heat;Ultrasound;Therapeutic exercise;Therapeutic activities;Neuromuscular re-education;Patient/family education;Manual techniques;Passive range of motion;Dry needling    PT Next Visit Plan  soft tissue work to lower abdomen; correct pelvis; joint mobilization to lumbar; dry needling to right quadratus ;back stretches    Consulted and Agree with Plan of Care  Patient       Patient will benefit from skilled therapeutic intervention in order to improve the following deficits and impairments:  Increased fascial restricitons, Pain, Decreased mobility, Increased muscle spasms, Decreased activity tolerance, Decreased endurance, Decreased range of motion  Visit Diagnosis: Cramp and spasm - Plan: PT plan of care cert/re-cert     Problem List Patient Active Problem List   Diagnosis Date Noted  . Precordial chest pain 10/01/2014  . Appendicitis, acute 03/16/2012  . Thyroid disorder 03/16/2012    Earlie Counts, PT 01/27/18 12:04 PM    [facility] [address], [place], [place], [address] Phone: [phone]   Fax:  [phone]  Name: BECCA BAYNE MRN: [id number] Date of Birth: September 25, 1960

## 2018-02-03 ENCOUNTER — Encounter: Payer: Self-pay | Admitting: Physical Therapy

## 2018-02-03 ENCOUNTER — Ambulatory Visit: Payer: BC Managed Care – PPO | Attending: Obstetrics and Gynecology | Admitting: Physical Therapy

## 2018-02-03 DIAGNOSIS — R252 Cramp and spasm: Secondary | ICD-10-CM | POA: Insufficient documentation

## 2018-02-03 DIAGNOSIS — M6281 Muscle weakness (generalized): Secondary | ICD-10-CM | POA: Diagnosis present

## 2018-02-03 DIAGNOSIS — M545 Low back pain, unspecified: Secondary | ICD-10-CM

## 2018-02-03 DIAGNOSIS — G8929 Other chronic pain: Secondary | ICD-10-CM | POA: Diagnosis present

## 2018-02-03 DIAGNOSIS — R279 Unspecified lack of coordination: Secondary | ICD-10-CM | POA: Insufficient documentation

## 2018-02-03 NOTE — Therapy (Addendum)
[facility] [address], [place], [place], [address] Phone: [phone]   Fax:  [phone]  Physical Therapy Treatment  Patient Details  Name: Vicki Abbott MRN: [id number] Date of Birth: Jan 01, 1961 Referring Provider: Dr. Bjorn Pippin   Encounter Date: 02/03/2018  PT End of Session - 02/03/18 1140    Visit Number  2    Date for PT Re-Evaluation  03/10/18    Authorization Type  BCBS    PT Start Time  1100    PT Stop Time  1141    PT Time Calculation (min)  41 min    Activity Tolerance  Patient tolerated treatment well    Behavior During Therapy  [facility] for tasks assessed/performed       Past Medical History:  Diagnosis Date  . Anxiety   . PONV (postoperative nausea and vomiting)   . Thyroid disorder 03/16/2012    Past Surgical History:  Procedure Laterality Date  . APPENDECTOMY    . COLONOSCOPY    . DILATION AND CURETTAGE OF UTERUS    . LAPAROSCOPIC APPENDECTOMY  03/15/2012   Procedure: APPENDECTOMY LAPAROSCOPIC;  Surgeon: Zenovia Jarred, MD;  Location: [place];  Service: General;  Laterality: N/A;  . MASS EXCISION Right 11/25/2013   Procedure: BIOPSY MASS RIGHT ELBOW;  Surgeon: Wynonia Sours, MD;  Location: [place];  Service: Orthopedics;  Laterality: Right;  excision of mass antecubital fossa right elbow  . TONSILLECTOMY    . TOTAL ABDOMINAL HYSTERECTOMY  2/15  . TUBAL LIGATION      There were no vitals filed for this visit.  Subjective Assessment - 02/03/18 1102    Subjective  I do not have an infection.  I had an instillation today.  I am having symptoms at the bladder stem. She said I had a small bladder stem. My back is better. I am going to [place]  for 2 weeks.     Patient Stated Goals  reduce pain    Currently in Pain?  Yes    Pain Score  1     Pain Location  Pelvis    Pain Orientation  Mid;Lower    Pain Descriptors / Indicators  Discomfort    Pain Type  Acute pain    Pain Onset  In the past 7 days    Pain Frequency  Intermittent    Aggravating Factors   urniation    Pain Relieving Factors  heat    Multiple Pain Sites  No                    Pelvic Floor Special Questions - 02/03/18 0001    Pelvic Floor Internal Exam  Patient confirms identification and approves PT to treat the pelvic floor    Exam Type  Vaginal    Palpation  tightness in right urethra sphinter    Strength  good squeeze, good lift, able to hold agaisnt strong resistance        [facility] Adult PT Treatment/Exercise - 02/03/18 0001      Manual Therapy   Manual Therapy  Internal Pelvic Floor;Myofascial release    Myofascial Release  lower abdomen to release the fascia tissue    Internal Pelvic Floor  bilateral urethra shpincter, sides of the bladder, bil. levator ani             PT Education - 02/03/18 1139    Education Details  discussed about lubricants and sexual positions  to irritate the bladder stem    Person(s) Educated  Patient    Methods  Explanation;Demonstration;Verbal cues;Handout    Comprehension  Returned demonstration;Verbalized understanding       PT Short Term Goals - 02/03/18 1143      PT SHORT TERM GOAL #1   Title  pelvic pain with daily activites decreased >/= 25%    Time  3    Period  Weeks    Status  On-going      PT SHORT TERM GOAL #2   Title  independent with flexibility program    Time  3    Period  Weeks    Status  On-going        PT Long Term Goals - 01/27/18 1201      PT LONG TERM GOAL #1   Title  independent with HEP    Time  6    Period  Weeks    Status  New    Target Date  03/10/18      PT LONG TERM GOAL #2   Title  lower abdominal pain with activity decreased >/= 75% due to improved tissue mobility    Time  6    Period  Weeks    Status  New    Target Date  03/10/18      PT LONG TERM GOAL #3   Title  pain after urination decreased >/= 75%    Time  6    Period  Weeks    Status  New    Target Date   03/10/18            Plan - 02/03/18 1104    Clinical Impression Statement  Patient had an instillation today and emptied it.  Patient had tightness located on right urethra sphincter, left side of bladder, and left levator ani.  After manual work, patient had no pain and no tightness.  Patient reports no back pain.  Patient will be going on vacation for 2 weeks.  Patient will benefi tfrom skilled therapy to reduce pain and improe joint and tissue mobility.     Rehab Potential  Excellent    Clinical Impairments Affecting Rehab Potential  Appendectomy; Abdominal hysterectomy 2/15    PT Frequency  1x / week    PT Duration  6 weeks    PT Treatment/Interventions  Cryotherapy;Electrical Stimulation;Moist Heat;Ultrasound;Therapeutic exercise;Therapeutic activities;Neuromuscular re-education;Patient/family education;Manual techniques;Passive range of motion;Dry needling    PT Next Visit Plan  soft tissue work to lower abdomen; correct pelvis; joint mobilization to lumbar; dry needling to right quadratus ;back stretches; see how patient did after vacation.    Consulted and Agree with Plan of Care  Patient       Patient will benefit from skilled therapeutic intervention in order to improve the following deficits and impairments:  Increased fascial restricitons, Pain, Decreased mobility, Increased muscle spasms, Decreased activity tolerance, Decreased endurance, Decreased range of motion  Visit Diagnosis: Cramp and spasm  Muscle weakness (generalized)  Chronic bilateral low back pain without sciatica  Unspecified lack of coordination     Problem List Patient Active Problem List   Diagnosis Date Noted  . Precordial chest pain 10/01/2014  . Appendicitis, acute 03/16/2012  . Thyroid disorder 03/16/2012    Earlie Counts, PT 02/03/18 11:44 AM   [facility] [address], [place], [place], [address] Phone: [phone]   Fax:   [phone]  Name: Vicki Abbott MRN: [id number] Date of Birth:  [birth year]  PHYSICAL THERAPY DISCHARGE SUMMARY  Visits from Start of Care: 2  Current functional level related to goals / functional outcomes: See above. Patient called to report she is doing well so she can be discharged.    Remaining deficits: See above.    Education / Equipment: HEP Plan: Patient agrees to discharge.  Patient goals were partially met. Patient is being discharged due to being pleased with the current functional level. Thank you for the referral. Earlie Counts, PT 03/09/18 5:01 PM   ?????

## 2018-02-17 ENCOUNTER — Encounter: Payer: BC Managed Care – PPO | Admitting: Physical Therapy

## 2018-02-24 ENCOUNTER — Encounter: Payer: BC Managed Care – PPO | Admitting: Physical Therapy

## 2018-03-30 DIAGNOSIS — K1370 Unspecified lesions of oral mucosa: Secondary | ICD-10-CM | POA: Insufficient documentation

## 2018-04-07 IMAGING — [facility] DIGITAL DIAGNOSTIC UNILATERAL LEFT MAMMOGRAM WITH TOMO AND CAD
4 series · 4 of 12 positions shown · non-contrast
Comparison: Previous exam(s).

CLINICAL DATA: Screening recall for a left breast asymmetry.

EXAM:
DIGITAL DIAGNOSTIC UNILATERAL LEFT MAMMOGRAM WITH CAD AND TOMO

[L MLO synth-2D]
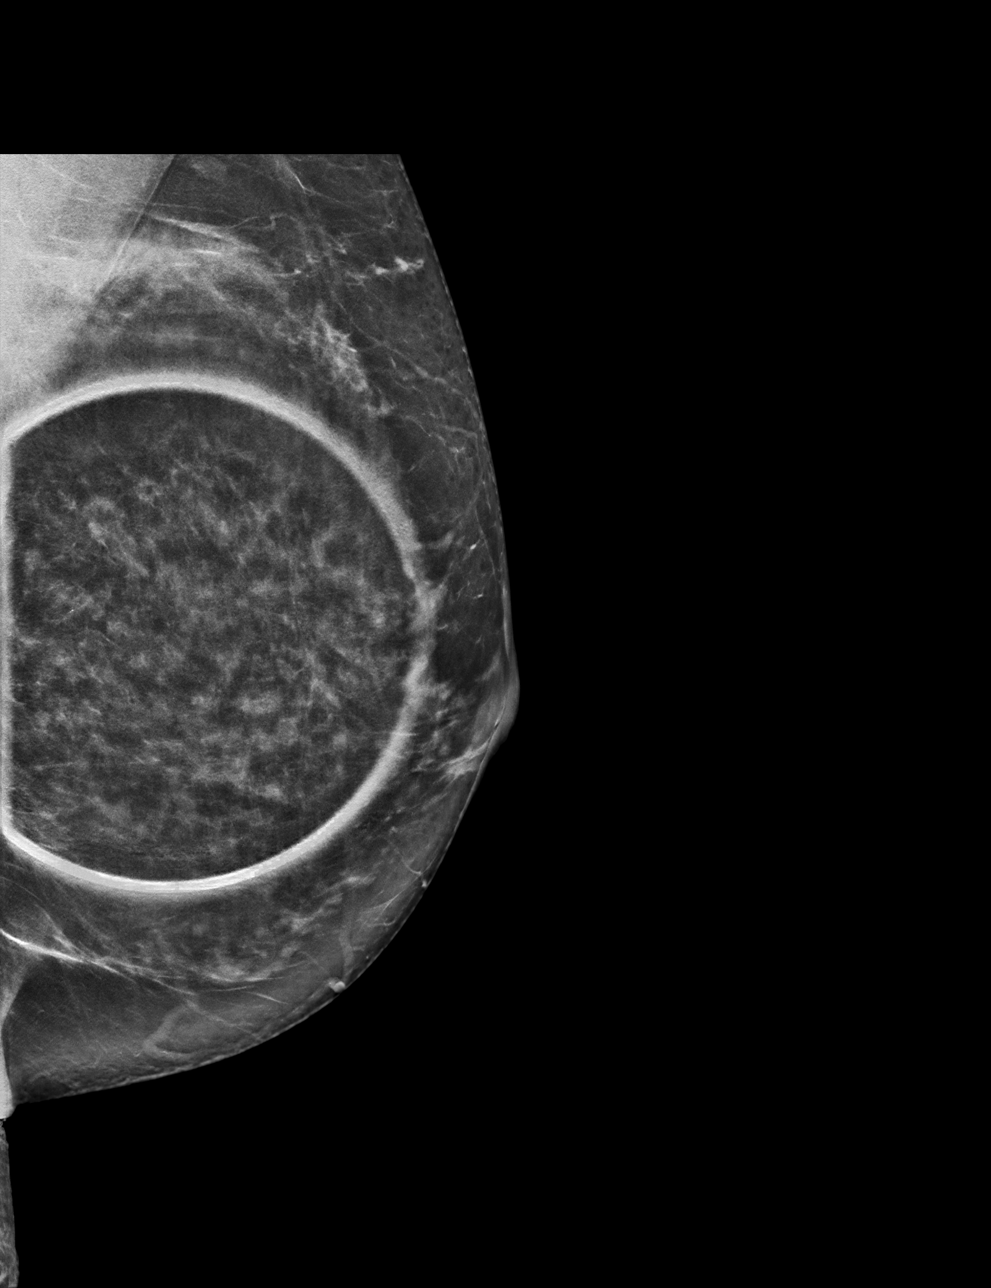

[L CC synth-2D]
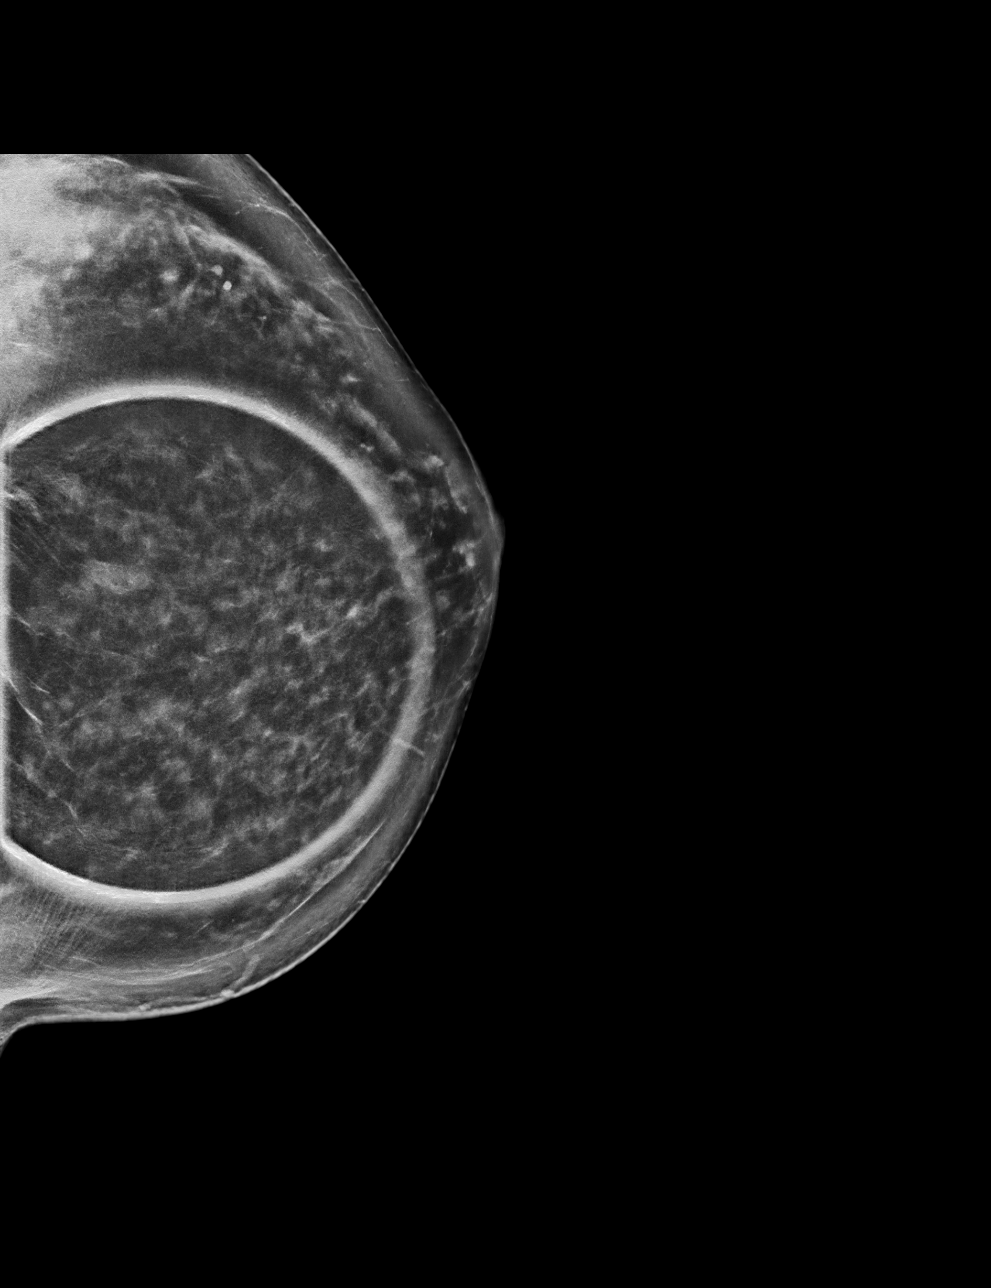

[L CC tomo · tomo slice 41/82.0]
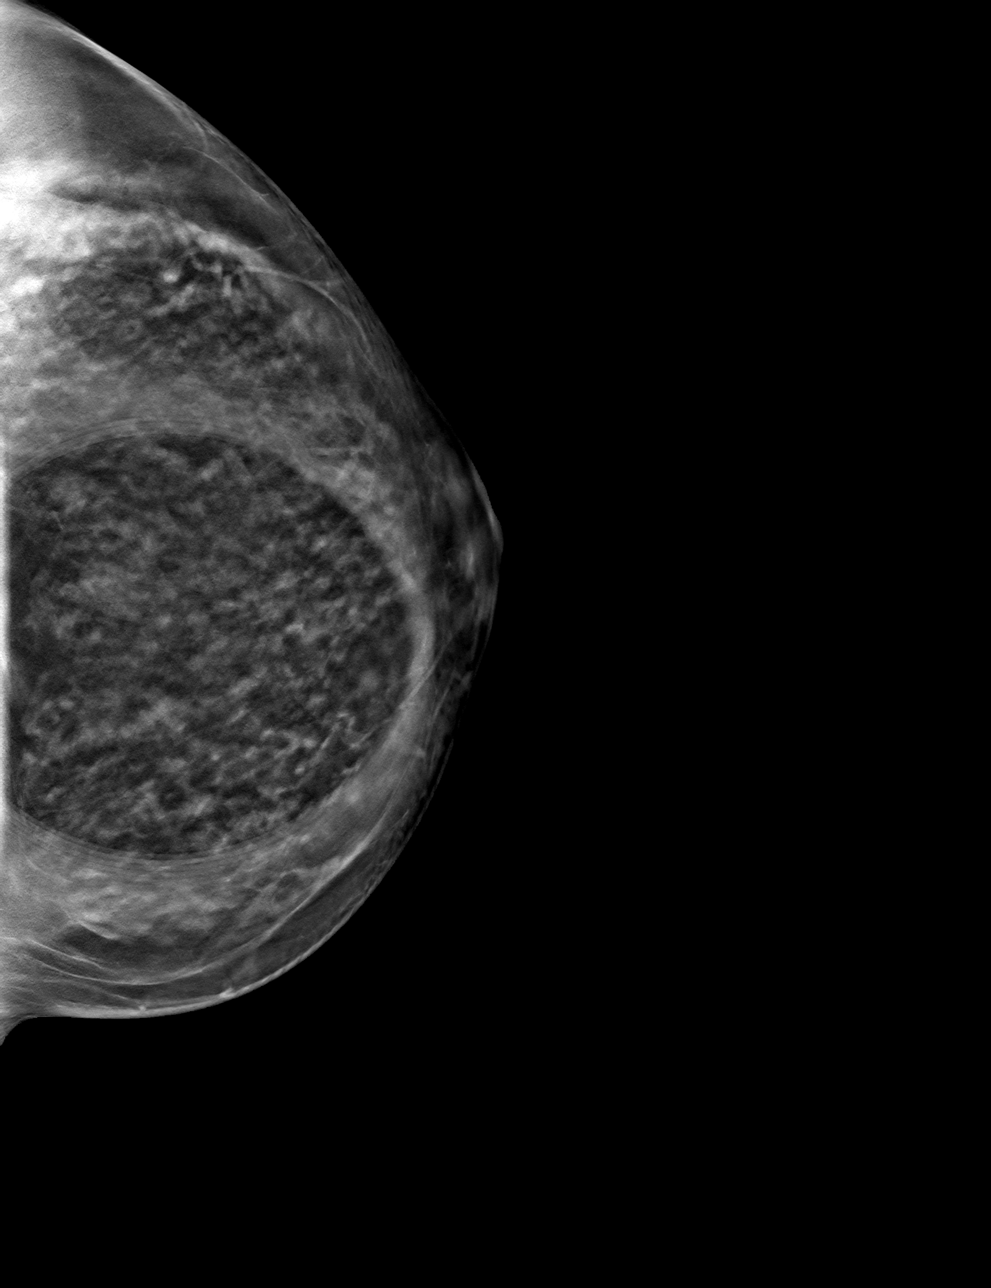

[L MLO tomo · tomo slice 41/81.0]
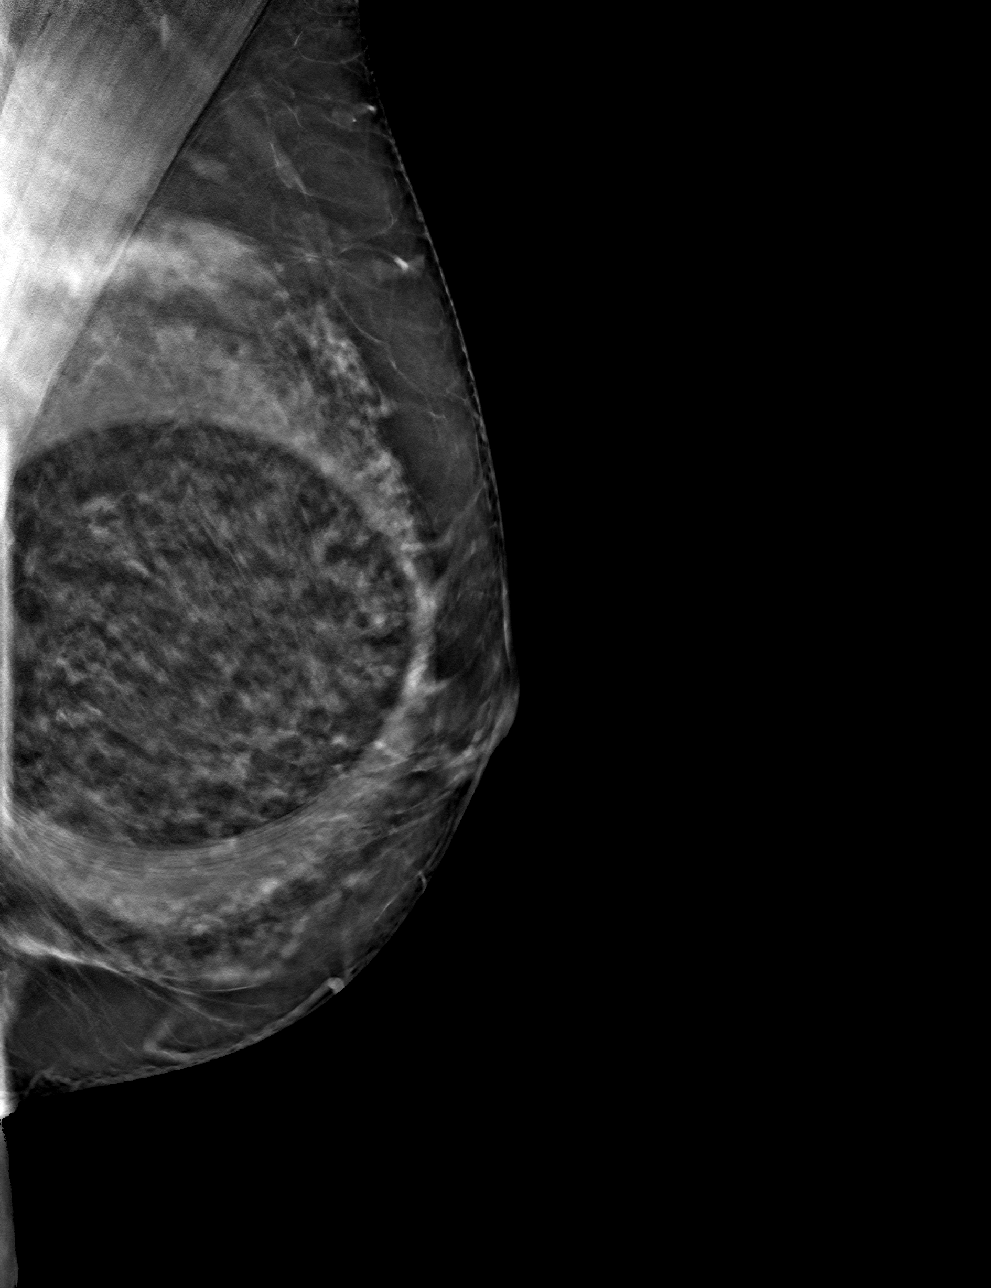

[4 of 12 positions shown; findings below may reference images not displayed]

ACR Breast Density Category c: The breast tissue is heterogeneously
dense, which may obscure small masses.
FINDINGS: The asymmetry of concern in the far posterior central left breast is
stable as compared to prior exams, in particular the diagnostic
mammogram from Wednesday April, 2008. No suspicious calcifications,
masses or areas of distortion are seen in the left breast.

Mammographic images were processed with CAD.
IMPRESSION: The asymmetry in the left breast is mammographically stable at least
8337 and is therefore benign.

RECOMMENDATION:
Screening mammogram in one year.(Code:CG-H-SFL)

I have discussed the findings and recommendations with the patient.
Results were also provided in writing at the conclusion of the
visit. If applicable, a reminder letter will be sent to the patient
regarding the next appointment.

BI-RADS CATEGORY  2: Benign.

## 2018-04-17 ENCOUNTER — Emergency Department (HOSPITAL_COMMUNITY): Payer: BC Managed Care – PPO

## 2018-04-17 ENCOUNTER — Emergency Department (HOSPITAL_COMMUNITY)
Admission: EM | Admit: 2018-04-17 | Discharge: 2018-04-17 | Disposition: A | Discharge status: Home / Self Care | Payer: BC Managed Care – PPO | Attending: Emergency Medicine | Admitting: Emergency Medicine

## 2018-04-17 ENCOUNTER — Encounter (HOSPITAL_COMMUNITY): Payer: Self-pay | Admitting: Emergency Medicine

## 2018-04-17 DIAGNOSIS — Z79899 Other long term (current) drug therapy: Secondary | ICD-10-CM | POA: Insufficient documentation

## 2018-04-17 DIAGNOSIS — E079 Disorder of thyroid, unspecified: Secondary | ICD-10-CM | POA: Insufficient documentation

## 2018-04-17 DIAGNOSIS — R072 Precordial pain: Secondary | ICD-10-CM | POA: Insufficient documentation

## 2018-04-17 LAB — CBC
HEMATOCRIT: 37.6 % (ref 36.0–46.0)
Hemoglobin: 12.4 g/dL (ref 12.0–15.0)
MCH: 32.1 pg (ref 26.0–34.0)
MCHC: 33 g/dL (ref 30.0–36.0)
MCV: 97.4 fL (ref 80.0–100.0)
Platelets: 243 10*3/uL (ref 150–400)
RBC: 3.86 MIL/uL — AB (ref 3.87–5.11)
RDW: 12.6 % (ref 11.5–15.5)
WBC: 4.7 10*3/uL (ref 4.0–10.5)
nRBC: 0 % (ref 0.0–0.2)

## 2018-04-17 LAB — BASIC METABOLIC PANEL WITH GFR
Anion gap: 8 (ref 5–15)
BUN: 22 mg/dL — ABNORMAL HIGH (ref 6–20)
CO2: 25 mmol/L (ref 22–32)
Calcium: 9 mg/dL (ref 8.9–10.3)
Chloride: 102 mmol/L (ref 98–111)
Creatinine, Ser: 1.27 mg/dL — ABNORMAL HIGH (ref 0.44–1.00)
GFR calc Af Amer: 52 mL/min — ABNORMAL LOW
GFR calc non Af Amer: 45 mL/min — ABNORMAL LOW
Glucose, Bld: 101 mg/dL — ABNORMAL HIGH (ref 70–99)
Potassium: 3.8 mmol/L (ref 3.5–5.1)
Sodium: 135 mmol/L (ref 135–145)

## 2018-04-17 LAB — I-STAT TROPONIN, ED: Troponin i, poc: 0 ng/mL (ref 0.00–0.08)

## 2018-04-17 MED ORDER — SODIUM CHLORIDE 0.9 % IV SOLN: INTRAVENOUS | Status: DC

## 2018-04-17 NOTE — ED Provider Notes (Signed)
[facility] EMERGENCY DEPARTMENT Provider Note   CSN: [id number] Arrival date & time: 04/17/18  3532     History   Chief Complaint Chief Complaint  Patient presents with  . Chest Pain    HPI Vicki Abbott is a 57 y.o. female.  Patient c/o mid chest pain onset at rest around 1 AM last night. Pain occurred at rest. Constant since. Pain is dull, mild-mod, non radiating, not pleuritic. ?mild heartburn. Denies vomiting or diaphoresis. No sob. Denies abd pain, no back pain. No leg pain or swelling. No hx dvt or pe. Notes stress test a few years ago for chest pain - states normal. No recent surgery, trauma or immobility. No other recent chest pain. No exertional chest pain or unusual doe.   The history is provided by the patient.  Chest Pain   Pertinent negatives include no abdominal pain, no back pain, no cough, no fever, no headaches, no shortness of breath and no vomiting.    Past Medical History:  Diagnosis Date  . Anxiety   . PONV (postoperative nausea and vomiting)   . Thyroid disorder 03/16/2012    Patient Active Problem List   Diagnosis Date Noted  . Precordial chest pain 10/01/2014  . Appendicitis, acute 03/16/2012  . Thyroid disorder 03/16/2012    Past Surgical History:  Procedure Laterality Date  . APPENDECTOMY    . COLONOSCOPY    . DILATION AND CURETTAGE OF UTERUS    . LAPAROSCOPIC APPENDECTOMY  03/15/2012   Procedure: APPENDECTOMY LAPAROSCOPIC;  Surgeon: Zenovia Jarred, MD;  Location: [facility];  Service: General;  Laterality: N/A;  . MASS EXCISION Right 11/25/2013   Procedure: BIOPSY MASS RIGHT ELBOW;  Surgeon: Wynonia Sours, MD;  Location: [facility];  Service: Orthopedics;  Laterality: Right;  excision of mass antecubital fossa right elbow  . TONSILLECTOMY    . TOTAL ABDOMINAL HYSTERECTOMY  2/15  . TUBAL LIGATION       OB History   None      Home Medications    Prior to Admission medications   Medication Sig  Start Date End Date Taking? Authorizing Provider  acetaminophen (TYLENOL) 325 MG tablet Take 2 tablets (650 mg total) by mouth every 4 (four) hours as needed. 03/16/12   Earnstine Regal, PA-C  amitriptyline (ELAVIL) 10 MG tablet Take 10 mg by mouth at bedtime.    [provider]  buPROPion (WELLBUTRIN XL) 300 MG 24 hr tablet Take 300 mg by mouth daily.    [provider]  escitalopram (LEXAPRO) 5 MG tablet Take 5 mg by mouth daily.    [provider]  fluticasone (FLONASE) 50 MCG/ACT nasal spray Place 1 spray into both nostrils daily.    [provider]  gabapentin (NEURONTIN) 300 MG capsule Take 100 mg by mouth 3 (three) times daily.     [provider]  HYDROcodone-acetaminophen (NORCO) 5-325 MG per tablet Take 1 tablet by mouth every 6 (six) hours as needed for moderate pain. Patient not taking: Reported on 08/26/2016 11/25/13   Daryll Brod, MD  ibuprofen (ADVIL) 200 MG tablet You can take 2-3 tablets every 6 hours as needed for pain. 03/16/12   Earnstine Regal, PA-C  levothyroxine (SYNTHROID, LEVOTHROID) 100 MCG tablet Take 100 mcg by mouth daily. 08/06/14   [provider]  loratadine (CLARITIN) 10 MG tablet Take 10 mg by mouth daily as needed. For allergy symptoms    [provider]  nitrofurantoin, macrocrystal-monohydrate, (MACROBID)  100 MG capsule Take 100 mg by mouth 2 (two) times daily.    [provider]  PREMARIN 0.9 MG tablet Take 0.9 mg by mouth 2 (two) times daily. 08/01/14   [provider]  promethazine (PHENERGAN) 6.25 MG/5ML syrup Take 10 mLs (12.5 mg total) by mouth every 6 (six) hours as needed for nausea or vomiting. 11/25/13   Daryll Brod, MD    Family History Family History  Problem Relation Age of Onset  . Diabetes Mother   . Hypertension Mother   . Stroke Mother 31  . Hypertension Father   . Breast cancer Paternal Aunt 49    Social History Social History   Tobacco Use  . Smoking  status: Never Smoker  . Smokeless tobacco: Never Used  Substance Use Topics  . Alcohol use: Yes    Comment: occ  . Drug use: No     Allergies   Augmentin [amoxicillin-pot clavulanate]; Penicillins; and Sulfa antibiotics   Review of Systems Review of Systems  Constitutional: Negative for fever.  HENT: Negative for sore throat.   Eyes: Negative for redness.  Respiratory: Negative for cough and shortness of breath.   Cardiovascular: Positive for chest pain. Negative for leg swelling.  Gastrointestinal: Negative for abdominal pain and vomiting.  Genitourinary: Negative for flank pain.  Musculoskeletal: Negative for back pain and neck pain.  Skin: Negative for rash.  Neurological: Negative for headaches.  Hematological: Does not bruise/bleed easily.  Psychiatric/Behavioral: Negative for confusion.     Physical Exam Updated Vital Signs BP (!) 152/81 (BP Location: Right Arm)   Pulse 86   Temp 98.6 F (37 C) (Oral)   Resp 14   Ht 1.727 m (5\' 8" )   Wt 63.5 kg   LMP 01/12/2012   SpO2 100%   BMI 21.29 kg/m   Physical Exam  Constitutional: She appears well-developed and well-nourished.  HENT:  Head: Atraumatic.  Eyes: Conjunctivae are normal. No scleral icterus.  Neck: Neck supple. No tracheal deviation present.  Cardiovascular: Normal rate, regular rhythm, normal heart sounds and intact distal pulses. Exam reveals no gallop and no friction rub.  No murmur heard. Pulmonary/Chest: Effort normal and breath sounds normal. No respiratory distress.  Abdominal: Soft. Normal appearance and bowel sounds are normal. She exhibits no distension. There is no tenderness.  Musculoskeletal: She exhibits no edema or tenderness.  Neurological: She is alert.  Skin: Skin is warm and dry. No rash noted.  Psychiatric: She has a normal mood and affect.  Nursing note and vitals reviewed. .   ED Treatments / Results  Labs (all labs ordered are listed, but only abnormal results are  displayed) Results for orders placed or performed during the hospital encounter of 04/17/18  CBC  Result Value Ref Range   WBC 4.7 4.0 - 10.5 K/uL   RBC 3.86 (L) 3.87 - 5.11 MIL/uL   Hemoglobin 12.4 12.0 - 15.0 g/dL   HCT 37.6 36.0 - 46.0 %   MCV 97.4 80.0 - 100.0 fL   MCH 32.1 26.0 - 34.0 pg   MCHC 33.0 30.0 - 36.0 g/dL   RDW 12.6 11.5 - 15.5 %   Platelets 243 150 - 400 K/uL   nRBC 0.0 0.0 - 0.2 %  Basic metabolic panel  Result Value Ref Range   Sodium 135 135 - 145 mmol/L   Potassium 3.8 3.5 - 5.1 mmol/L   Chloride 102 98 - 111 mmol/L   CO2 25 22 - 32 mmol/L   Glucose, Bld  101 (H) 70 - 99 mg/dL   BUN 22 (H) 6 - 20 mg/dL   Creatinine, Ser 1.27 (H) 0.44 - 1.00 mg/dL   Calcium 9.0 8.9 - 10.3 mg/dL   GFR calc non Af Amer 45 (L) >60 mL/min   GFR calc Af Amer 52 (L) >60 mL/min   Anion gap 8 5 - 15  I-stat troponin, ED  Result Value Ref Range   Troponin i, poc 0.00 0.00 - 0.08 ng/mL   Comment 3           Dg Chest Port 1 View  Result Date: 04/17/2018 CLINICAL DATA:  Left-sided chest pain. EXAM: PORTABLE CHEST 1 VIEW COMPARISON:  08/30/2014 FINDINGS: Scarring in the left upper lobe. No confluent opacities or effusions. Heart is normal size. No acute bony abnormality. IMPRESSION: No active disease. Electronically Signed   By: Rolm Baptise M.D.   On: 04/17/2018 19:19    EKG EKG Interpretation  Date/Time:  Saturday April 17 2018 19:01:51 EST Ventricular Rate:  86 PR Interval:    QRS Duration: 92 QT Interval:  374 QTC Calculation: 448 R Axis:   70 Text Interpretation:  Sinus rhythm No significant change since last tracing Confirmed by Lajean Saver [phone]) on 04/17/2018 7:13:59 PM   Radiology Dg Chest Port 1 View  Result Date: 04/17/2018 CLINICAL DATA:  Left-sided chest pain. EXAM: PORTABLE CHEST 1 VIEW COMPARISON:  08/30/2014 FINDINGS: Scarring in the left upper lobe. No confluent opacities or effusions. Heart is normal size. No acute bony abnormality. IMPRESSION: No  active disease. Electronically Signed   By: Rolm Baptise M.D.   On: 04/17/2018 19:19    Procedures Procedures (including critical care time)  Medications Ordered in ED Medications  0.9 %  sodium chloride infusion (has no administration in time range)     Initial Impression / Assessment and Plan / ED Course  I have reviewed the triage vital signs and the nursing notes.  Pertinent labs & imaging results that were available during my care of the patient were reviewed by me and considered in my medical decision making (see chart for details).  Labs. Ecg. Cxr.  Reviewed nursing notes and prior charts for additional history.   Will try acetaminophen and pepcid/maalox for symptom relief.   cxr reviewed - no pna.  Labs reviewed - after constant symptoms for approximately 19-20 hours, trop is normal/0, not c/w ACS.   Discussed results w pt.   Pt currently symptom free, and appears stable for d/c.     Final Clinical Impressions(s) / ED Diagnoses   Final diagnoses:  None    ED Discharge Orders    None       Lajean Saver, MD 04/17/18 2021

## 2018-04-17 NOTE — Discharge Instructions (Signed)
It was our pleasure to provide your ER care today - we hope that you feel better.  From today's labs, your heart test (troponin) is normal, and chest xray looks good.  Also from the lab tests, your kidney function is mildly elevated today (creatinine 1.27) - drink plenty of fluids, and avoid taking non-steroidal anti-inflammatory type pain meds such as ibuprofen/motrin, or naprosyn/aleve. Follow up with primary care doctor - also have them recheck blood pressure as it is mildly high today.  For chest discomfort, follow up with cardiology in the next 1-2 weeks - call Monday to arrange follow up.  Return to ER if worse, new symptoms, recurrent/persistent chest pain, increased trouble breathing, other concern.

## 2018-04-17 NOTE — ED Triage Notes (Signed)
Patient to ED c/o mid to L-sided chest pain that woke her up at 1am this morning - she states it has been constant since and is uncomfortable, aching and burning in nature. Last took ASA last night, denies cardiac history or having this pain before. Also endorses shortness of breath and nausea. Denies dizziness, cough, or fevers. Resp e/u, skin warm/dry.

## 2018-09-20 ENCOUNTER — Other Ambulatory Visit: Payer: Self-pay | Admitting: Obstetrics & Gynecology

## 2018-09-20 DIAGNOSIS — R928 Other abnormal and inconclusive findings on diagnostic imaging of breast: Secondary | ICD-10-CM

## 2018-09-23 ENCOUNTER — Ambulatory Visit
Admission: RE | Admit: 2018-09-23 | Discharge: 2018-09-23 | Disposition: A | Discharge status: Home / Self Care | Payer: BC Managed Care – PPO | Source: Ambulatory Visit | Attending: Obstetrics & Gynecology | Admitting: Obstetrics & Gynecology

## 2018-09-23 ENCOUNTER — Other Ambulatory Visit: Payer: Self-pay

## 2018-09-23 DIAGNOSIS — R928 Other abnormal and inconclusive findings on diagnostic imaging of breast: Secondary | ICD-10-CM

## 2018-09-25 ENCOUNTER — Other Ambulatory Visit: Payer: Self-pay

## 2018-09-25 ENCOUNTER — Emergency Department (HOSPITAL_COMMUNITY): Payer: BC Managed Care – PPO

## 2018-09-25 ENCOUNTER — Encounter (HOSPITAL_COMMUNITY): Payer: Self-pay

## 2018-09-25 ENCOUNTER — Emergency Department (HOSPITAL_COMMUNITY)
Admission: EM | Admit: 2018-09-25 | Discharge: 2018-09-25 | Disposition: A | Discharge status: Home / Self Care | Payer: BC Managed Care – PPO | Attending: Emergency Medicine | Admitting: Emergency Medicine

## 2018-09-25 DIAGNOSIS — Z79899 Other long term (current) drug therapy: Secondary | ICD-10-CM | POA: Insufficient documentation

## 2018-09-25 DIAGNOSIS — K5900 Constipation, unspecified: Secondary | ICD-10-CM | POA: Insufficient documentation

## 2018-09-25 DIAGNOSIS — R109 Unspecified abdominal pain: Secondary | ICD-10-CM | POA: Diagnosis present

## 2018-09-25 LAB — COMPREHENSIVE METABOLIC PANEL
ALT: 15 U/L (ref 0–44)
AST: 21 U/L (ref 15–41)
Albumin: 3.6 g/dL (ref 3.5–5.0)
Alkaline Phosphatase: 43 U/L (ref 38–126)
Anion gap: 12 (ref 5–15)
BUN: 12 mg/dL (ref 6–20)
CO2: 20 mmol/L — ABNORMAL LOW (ref 22–32)
Calcium: 8.4 mg/dL — ABNORMAL LOW (ref 8.9–10.3)
Chloride: 99 mmol/L (ref 98–111)
Creatinine, Ser: 1.05 mg/dL — ABNORMAL HIGH (ref 0.44–1.00)
GFR calc Af Amer: >60 mL/min (ref >60)
GFR calc non Af Amer: 58 mL/min — ABNORMAL LOW (ref >60)
Glucose, Bld: 95 mg/dL (ref 70–99)
Potassium: 4.3 mmol/L (ref 3.5–5.1)
Sodium: 131 mmol/L — ABNORMAL LOW (ref 135–145)
Total Bilirubin: 0.3 mg/dL (ref 0.3–1.2)
Total Protein: 6.5 g/dL (ref 6.5–8.1)

## 2018-09-25 LAB — URINALYSIS, ROUTINE W REFLEX MICROSCOPIC
Bilirubin Urine: NEGATIVE
Glucose, UA: NEGATIVE mg/dL
Hgb urine dipstick: NEGATIVE
Ketones, ur: 20 mg/dL — AB
Leukocytes,Ua: NEGATIVE
Nitrite: NEGATIVE
Protein, ur: NEGATIVE mg/dL
Specific Gravity, Urine: 1.017 (ref 1.005–1.030)
pH: 5 (ref 5.0–8.0)

## 2018-09-25 LAB — CBC WITH DIFFERENTIAL/PLATELET
Abs Immature Granulocytes: 0.01 10*3/uL (ref 0.00–0.07)
Basophils Absolute: 0 10*3/uL (ref 0.0–0.1)
Basophils Relative: 1 %
Eosinophils Absolute: 0 10*3/uL (ref 0.0–0.5)
Eosinophils Relative: 1 %
HCT: 37.6 % (ref 36.0–46.0)
Hemoglobin: 12.6 g/dL (ref 12.0–15.0)
Immature Granulocytes: 0 %
Lymphocytes Relative: 23 %
Lymphs Abs: 1.5 10*3/uL (ref 0.7–4.0)
MCH: 32.6 pg (ref 26.0–34.0)
MCHC: 33.5 g/dL (ref 30.0–36.0)
MCV: 97.4 fL (ref 80.0–100.0)
Monocytes Absolute: 0.5 10*3/uL (ref 0.1–1.0)
Monocytes Relative: 7 %
Neutro Abs: 4.5 10*3/uL (ref 1.7–7.7)
Neutrophils Relative %: 68 %
Platelets: 271 10*3/uL (ref 150–400)
RBC: 3.86 MIL/uL — ABNORMAL LOW (ref 3.87–5.11)
RDW: 12.5 % (ref 11.5–15.5)
WBC: 6.5 10*3/uL (ref 4.0–10.5)
nRBC: 0 % (ref 0.0–0.2)

## 2018-09-25 LAB — LIPASE, BLOOD: Lipase: 34 U/L (ref 11–51)

## 2018-09-25 MED ORDER — SODIUM CHLORIDE 0.9 % IV BOLUS
1000.0000 mL | Freq: Once | INTRAVENOUS | Status: AC
  Administered 2018-09-25: 1000 mL via INTRAVENOUS

## 2018-09-25 MED ORDER — IOHEXOL 300 MG/ML  SOLN
100.0000 mL | Freq: Once | INTRAMUSCULAR | Status: AC | PRN
  Administered 2018-09-25: 100 mL via INTRAVENOUS

## 2018-09-25 NOTE — ED Triage Notes (Signed)
Pt comes in POV with swollen abdomen, constipation and abdominal pain x9 days. Today nausea and loss of appetite. Has seen GI doctor, prescribed various medications and enemas to no relief.

## 2018-09-25 NOTE — ED Provider Notes (Signed)
Pt's care assumed from [facility].  Pt has had constipation for the past 9 days.  Pt has tried multiple laxatives and enema's.  Pt's gi doctor started her on linzess.  Pt has a ct scan pending.  Results for orders placed or performed during the hospital encounter of 09/25/18  Comprehensive metabolic panel  Result Value Ref Range   Sodium 131 (L) 135 - 145 mmol/L   Potassium 4.3 3.5 - 5.1 mmol/L   Chloride 99 98 - 111 mmol/L   CO2 20 (L) 22 - 32 mmol/L   Glucose, Bld 95 70 - 99 mg/dL   BUN 12 6 - 20 mg/dL   Creatinine, Ser 1.05 (H) 0.44 - 1.00 mg/dL   Calcium 8.4 (L) 8.9 - 10.3 mg/dL   Total Protein 6.5 6.5 - 8.1 g/dL   Albumin 3.6 3.5 - 5.0 g/dL   AST 21 15 - 41 U/L   ALT 15 0 - 44 U/L   Alkaline Phosphatase 43 38 - 126 U/L   Total Bilirubin 0.3 0.3 - 1.2 mg/dL   GFR calc non Af Amer 58 (L) >60 mL/min   GFR calc Af Amer >60 >60 mL/min   Anion gap 12 5 - 15  CBC with Differential  Result Value Ref Range   WBC 6.5 4.0 - 10.5 K/uL   RBC 3.86 (L) 3.87 - 5.11 MIL/uL   Hemoglobin 12.6 12.0 - 15.0 g/dL   HCT 37.6 36.0 - 46.0 %   MCV 97.4 80.0 - 100.0 fL   MCH 32.6 26.0 - 34.0 pg   MCHC 33.5 30.0 - 36.0 g/dL   RDW 12.5 11.5 - 15.5 %   Platelets 271 150 - 400 K/uL   nRBC 0.0 0.0 - 0.2 %   Neutrophils Relative % 68 %   Neutro Abs 4.5 1.7 - 7.7 K/uL   Lymphocytes Relative 23 %   Lymphs Abs 1.5 0.7 - 4.0 K/uL   Monocytes Relative 7 %   Monocytes Absolute 0.5 0.1 - 1.0 K/uL   Eosinophils Relative 1 %   Eosinophils Absolute 0.0 0.0 - 0.5 K/uL   Basophils Relative 1 %   Basophils Absolute 0.0 0.0 - 0.1 K/uL   Immature Granulocytes 0 %   Abs Immature Granulocytes 0.01 0.00 - 0.07 K/uL  Urinalysis, Routine w reflex microscopic  Result Value Ref Range   Color, Urine YELLOW YELLOW   APPearance HAZY (A) CLEAR   Specific Gravity, Urine 1.017 1.005 - 1.030   pH 5.0 5.0 - 8.0   Glucose, UA NEGATIVE NEGATIVE mg/dL   Hgb urine dipstick NEGATIVE NEGATIVE   Bilirubin Urine NEGATIVE  NEGATIVE   Ketones, ur 20 (A) NEGATIVE mg/dL   Protein, ur NEGATIVE NEGATIVE mg/dL   Nitrite NEGATIVE NEGATIVE   Leukocytes,Ua NEGATIVE NEGATIVE  Lipase, blood  Result Value Ref Range   Lipase 34 11 - 51 U/L   Ct Abdomen Pelvis W Contrast  Result Date: 09/25/2018 CLINICAL DATA:  Abdominal swelling. Abdominal pain and constipation for 9 days. Nausea. EXAM: CT ABDOMEN AND PELVIS WITH CONTRAST TECHNIQUE: Multidetector CT imaging of the abdomen and pelvis was performed using the standard protocol following bolus administration of intravenous contrast. CONTRAST:  151mL OMNIPAQUE IOHEXOL 300 MG/ML  SOLN COMPARISON:  06/12/2015 FINDINGS: Lower chest: No acute abnormality. Hepatobiliary: Unremarkable Pancreas: Unremarkable Spleen: Unremarkable Adrenals/Urinary Tract: Adrenal glands are within normal limits. Stable appearance of the kidneys with prominence of the collecting system bilaterally. Bladder is within normal limits. Stomach/Bowel: No obvious mass in the  colon. Small bowel is decompressed. Small hiatal hernia is noted. Vascular/Lymphatic: Minimal atherosclerotic vascular calcifications of the aorta. No abnormal retroperitoneal adenopathy by short axis diameter measurement criteria. Reproductive: Uterus is absent. Other: No free fluid. Musculoskeletal: Slight dextroscoliosis in the upper lumbar spine. No vertebral compression deformity. IMPRESSION: No acute intra-abdominal pathology. Electronically Signed   By: Marybelle Killings M.D.   On: 09/25/2018 15:50   US Breast Ltd Uni Left Inc Axilla  Result Date: 09/23/2018 CLINICAL DATA:  Recall from screening mammography with tomosynthesis, possible masses involving both breasts. The patient is currently undergoing hormone replacement therapy. EXAM: DIGITAL DIAGNOSTIC BILATERAL MAMMOGRAM WITH CAD AND TOMO LIMITED ULTRASOUND BILATERAL BREASTS COMPARISON:  Previous exam(s). ACR Breast Density Category c: The breast tissue is heterogeneously dense, which may obscure  small masses. FINDINGS: Tomosynthesis and synthesized spot compression CC and MLO views of the area of concern in the LEFT breast, tomosynthesis and synthesized spot compression CC view of the area of concern in the RIGHT breast and standard and tomosynthesis full field mediolateral view of the RIGHT breast were obtained. A circumscribed low-density mass measuring approximately 1.4 cm is confirmed in the OUTER RIGHT breast at POSTERIOR depth, localizing in the retroareolar location on the full field mediolateral images. A similar circumscribed low-density mass measuring approximately 0.5 cm is confirmed in the LOWER OUTER QUADRANT of the RIGHT breast at POSTERIOR depth. Neither of these low-density masses are associated with architectural distortion or suspicious calcifications. A normal appearing approximate 0.7 cm lymph node is present in the retroareolar RIGHT breast at POSTERIOR depth. A circumscribed low-density measuring approximately 1 cm mass is confirmed in the OUTER LEFT breast at POSTERIOR depth without associated architectural distortion or suspicious calcifications. The full field mediolateral view of the RIGHT breast was processed with CAD. Targeted RIGHT breast ultrasound is performed, showing a benign simple cyst at the 9 o'clock position approximately 5 cm from nipple at POSTERIOR depth measuring approximately 0.6 x 1.2 x 1.3 cm, demonstrating posterior acoustic enhancement and no internal power Doppler flow, corresponding to the larger screening mammographic finding. At the 7 o'clock position approximately 4 cm from nipple at POSTERIOR depth is a benign simple cyst measuring approximately 0.4 cm, corresponding to the smaller screening mammographic finding. Scattered benign cysts are present elsewhere in OUTER RIGHT breast. No suspicious solid mass or abnormal acoustic shadowing is identified. Targeted LEFT breast ultrasound is performed, showing adjacent benign cysts at the 2 o'clock position  approximately 6 cm from the nipple. The larger cyst measures approximately 0.4 x 0.8 x 0.7 cm and the second cyst measures approximately 0.4 x 0.6 x 0.7 cm. The larger cyst corresponds to the screening mammographic finding. No suspicious solid mass or abnormal acoustic shadowing is identified. IMPRESSION: 1. Benign BILATERAL breast cysts which account for the screening mammographic finding. 2. No mammographic or sonographic evidence of malignancy involving either breast. RECOMMENDATION: Screening mammogram in one year.(Code:SM-B-01Y) I have discussed the findings and recommendations with the patient. If applicable, a reminder letter will be sent to the patient regarding the next appointment. BI-RADS CATEGORY  2: Benign. Electronically Signed   By: Evangeline Dakin M.D.   On: 09/23/2018 11:00   US Breast Ltd Uni Right Inc Axilla  Result Date: 09/23/2018 CLINICAL DATA:  Recall from screening mammography with tomosynthesis, possible masses involving both breasts. The patient is currently undergoing hormone replacement therapy. EXAM: DIGITAL DIAGNOSTIC BILATERAL MAMMOGRAM WITH CAD AND TOMO LIMITED ULTRASOUND BILATERAL BREASTS COMPARISON:  Previous exam(s). ACR Breast Density Category c: The  breast tissue is heterogeneously dense, which may obscure small masses. FINDINGS: Tomosynthesis and synthesized spot compression CC and MLO views of the area of concern in the LEFT breast, tomosynthesis and synthesized spot compression CC view of the area of concern in the RIGHT breast and standard and tomosynthesis full field mediolateral view of the RIGHT breast were obtained. A circumscribed low-density mass measuring approximately 1.4 cm is confirmed in the OUTER RIGHT breast at POSTERIOR depth, localizing in the retroareolar location on the full field mediolateral images. A similar circumscribed low-density mass measuring approximately 0.5 cm is confirmed in the LOWER OUTER QUADRANT of the RIGHT breast at POSTERIOR depth.  Neither of these low-density masses are associated with architectural distortion or suspicious calcifications. A normal appearing approximate 0.7 cm lymph node is present in the retroareolar RIGHT breast at POSTERIOR depth. A circumscribed low-density measuring approximately 1 cm mass is confirmed in the OUTER LEFT breast at POSTERIOR depth without associated architectural distortion or suspicious calcifications. The full field mediolateral view of the RIGHT breast was processed with CAD. Targeted RIGHT breast ultrasound is performed, showing a benign simple cyst at the 9 o'clock position approximately 5 cm from nipple at POSTERIOR depth measuring approximately 0.6 x 1.2 x 1.3 cm, demonstrating posterior acoustic enhancement and no internal power Doppler flow, corresponding to the larger screening mammographic finding. At the 7 o'clock position approximately 4 cm from nipple at POSTERIOR depth is a benign simple cyst measuring approximately 0.4 cm, corresponding to the smaller screening mammographic finding. Scattered benign cysts are present elsewhere in OUTER RIGHT breast. No suspicious solid mass or abnormal acoustic shadowing is identified. Targeted LEFT breast ultrasound is performed, showing adjacent benign cysts at the 2 o'clock position approximately 6 cm from the nipple. The larger cyst measures approximately 0.4 x 0.8 x 0.7 cm and the second cyst measures approximately 0.4 x 0.6 x 0.7 cm. The larger cyst corresponds to the screening mammographic finding. No suspicious solid mass or abnormal acoustic shadowing is identified. IMPRESSION: 1. Benign BILATERAL breast cysts which account for the screening mammographic finding. 2. No mammographic or sonographic evidence of malignancy involving either breast. RECOMMENDATION: Screening mammogram in one year.(Code:SM-B-01Y) I have discussed the findings and recommendations with the patient. If applicable, a reminder letter will be sent to the patient regarding the  next appointment. BI-RADS CATEGORY  2: Benign. Electronically Signed   By: Evangeline Dakin M.D.   On: 09/23/2018 11:00   Mm Diag Breast Tomo Bilateral  Result Date: 09/23/2018 CLINICAL DATA:  Recall from screening mammography with tomosynthesis, possible masses involving both breasts. The patient is currently undergoing hormone replacement therapy. EXAM: DIGITAL DIAGNOSTIC BILATERAL MAMMOGRAM WITH CAD AND TOMO LIMITED ULTRASOUND BILATERAL BREASTS COMPARISON:  Previous exam(s). ACR Breast Density Category c: The breast tissue is heterogeneously dense, which may obscure small masses. FINDINGS: Tomosynthesis and synthesized spot compression CC and MLO views of the area of concern in the LEFT breast, tomosynthesis and synthesized spot compression CC view of the area of concern in the RIGHT breast and standard and tomosynthesis full field mediolateral view of the RIGHT breast were obtained. A circumscribed low-density mass measuring approximately 1.4 cm is confirmed in the OUTER RIGHT breast at POSTERIOR depth, localizing in the retroareolar location on the full field mediolateral images. A similar circumscribed low-density mass measuring approximately 0.5 cm is confirmed in the LOWER OUTER QUADRANT of the RIGHT breast at POSTERIOR depth. Neither of these low-density masses are associated with architectural distortion or suspicious calcifications. A normal appearing  approximate 0.7 cm lymph node is present in the retroareolar RIGHT breast at POSTERIOR depth. A circumscribed low-density measuring approximately 1 cm mass is confirmed in the OUTER LEFT breast at POSTERIOR depth without associated architectural distortion or suspicious calcifications. The full field mediolateral view of the RIGHT breast was processed with CAD. Targeted RIGHT breast ultrasound is performed, showing a benign simple cyst at the 9 o'clock position approximately 5 cm from nipple at POSTERIOR depth measuring approximately 0.6 x 1.2 x 1.3 cm,  demonstrating posterior acoustic enhancement and no internal power Doppler flow, corresponding to the larger screening mammographic finding. At the 7 o'clock position approximately 4 cm from nipple at POSTERIOR depth is a benign simple cyst measuring approximately 0.4 cm, corresponding to the smaller screening mammographic finding. Scattered benign cysts are present elsewhere in OUTER RIGHT breast. No suspicious solid mass or abnormal acoustic shadowing is identified. Targeted LEFT breast ultrasound is performed, showing adjacent benign cysts at the 2 o'clock position approximately 6 cm from the nipple. The larger cyst measures approximately 0.4 x 0.8 x 0.7 cm and the second cyst measures approximately 0.4 x 0.6 x 0.7 cm. The larger cyst corresponds to the screening mammographic finding. No suspicious solid mass or abnormal acoustic shadowing is identified. IMPRESSION: 1. Benign BILATERAL breast cysts which account for the screening mammographic finding. 2. No mammographic or sonographic evidence of malignancy involving either breast. RECOMMENDATION: Screening mammogram in one year.(Code:SM-B-01Y) I have discussed the findings and recommendations with the patient. If applicable, a reminder letter will be sent to the patient regarding the next appointment. BI-RADS CATEGORY  2: Benign. Electronically Signed   By: Evangeline Dakin M.D.   On: 09/23/2018 11:00   MDM   Pt counseled on resluts of ct scan.  Pt encouraged to increase fluids, increase exercise, high fiber.  Pt advised to follow up with her Gi doctor for recheck.    Vicki Abbott 09/25/18 1733    Lajean Saver, MD 09/25/18 2113

## 2018-09-25 NOTE — Discharge Instructions (Addendum)
Continue linzess.  Try Mag citrate to help with acute constipation.   Your Ct scan is normal.  See your Physician for recheck.   Increase water intake,  Increase fiber, Increase exercise

## 2018-09-26 NOTE — ED Provider Notes (Signed)
[facility] Provider Note   CSN: [id number] Arrival date & time: 09/25/18  1247    History   Chief Complaint Chief Complaint  Patient presents with   Abdominal Pain    HPI Vicki Abbott is a 58 y.o. female.     HPI Patient presents to the emergency department with nominal pain that is been ongoing over the last 3 to 4 days.  The patient states she had constipation over the last 9 days she is tried multiple laxatives and enemas along with seeing her GI doctor.  Patient was started on Linzess which did not seem to help her symptoms.  The patient states the condition has been painful over the 9 days but got worse over the last 3 to 4 days.  Patient states that she has not found any alleviating factors to the pain.  Patient states there is radiation of the pain to the back.  The patient denies chest pain, shortness of breath, headache,blurred vision, neck pain, fever, cough, weakness, numbness, dizziness, anorexia, edema, vomiting, diarrhea, rash, back pain, dysuria, hematemesis, bloody stool, near syncope, or syncope. Past Medical History:  Diagnosis Date   Anxiety    PONV (postoperative nausea and vomiting)    Thyroid disorder 03/16/2012    Patient Active Problem List   Diagnosis Date Noted   Precordial chest pain 10/01/2014   Appendicitis, acute 03/16/2012   Thyroid disorder 03/16/2012    Past Surgical History:  Procedure Laterality Date   APPENDECTOMY     COLONOSCOPY     DILATION AND CURETTAGE OF UTERUS     LAPAROSCOPIC APPENDECTOMY  03/15/2012   Procedure: APPENDECTOMY LAPAROSCOPIC;  Surgeon: Zenovia Jarred, MD;  Location: [facility];  Service: General;  Laterality: N/A;   MASS EXCISION Right 11/25/2013   Procedure: BIOPSY MASS RIGHT ELBOW;  Surgeon: Wynonia Sours, MD;  Location: [facility];  Service: Orthopedics;  Laterality: Right;  excision of mass antecubital fossa right elbow   TONSILLECTOMY      TOTAL ABDOMINAL HYSTERECTOMY  2/15   TUBAL LIGATION       OB History   No obstetric history on file.      Home Medications    Prior to Admission medications   Medication Sig Start Date End Date Taking? Authorizing Provider  buPROPion (WELLBUTRIN XL) 300 MG 24 hr tablet Take 300 mg by mouth daily.   Yes [provider]  escitalopram (LEXAPRO) 5 MG tablet Take 2.5 mg by mouth daily.    Yes [provider]  gabapentin (NEURONTIN) 100 MG capsule Take 100 mg by mouth at bedtime.    Yes [provider]  levothyroxine (SYNTHROID, LEVOTHROID) 100 MCG tablet Take 100 mcg by mouth daily. 08/06/14  Yes [provider]  linaclotide (LINZESS) 290 MCG CAPS capsule Take 290 mcg by mouth daily before breakfast.   Yes [provider]  loratadine (CLARITIN) 10 MG tablet Take 10 mg by mouth daily as needed for allergies.    Yes [provider]  PREMARIN 1.25 MG tablet Take 1.25 mg by mouth daily.  08/01/14  Yes [provider]  acetaminophen (TYLENOL) 325 MG tablet Take 2 tablets (650 mg total) by mouth every 4 (four) hours as needed. Patient not taking: Reported on 09/25/2018 03/16/12   Earnstine Regal, PA-C  HYDROcodone-acetaminophen [facility]) 5-325 MG per tablet Take 1 tablet by mouth every 6 (six) hours as needed for moderate pain. Patient not taking: Reported on 08/26/2016 11/25/13  Daryll Brod, MD  ibuprofen (ADVIL) 200 MG tablet You can take 2-3 tablets every 6 hours as needed for pain. Patient not taking: Reported on 09/25/2018 03/16/12   Earnstine Regal, PA-C  promethazine (PHENERGAN) 6.25 MG/5ML syrup Take 10 mLs (12.5 mg total) by mouth every 6 (six) hours as needed for nausea or vomiting. Patient not taking: Reported on 09/25/2018 11/25/13   Daryll Brod, MD    Family History Family History  Problem Relation Age of Onset   Diabetes Mother    Hypertension Mother    Stroke Mother 24   Hypertension Father    Breast cancer  Paternal Aunt 12    Social History Social History   Tobacco Use   Smoking status: Never Smoker   Smokeless tobacco: Never Used  Substance Use Topics   Alcohol use: Yes    Comment: occ   Drug use: No     Allergies   Augmentin [amoxicillin-pot clavulanate]; Penicillins; and Sulfa antibiotics   Review of Systems Review of Systems  All other systems negative except as documented in the HPI. All pertinent positives and negatives as reviewed in the HPI. Physical Exam Updated Vital Signs BP (!) 141/80 (BP Location: Right Arm)    Pulse 90    Temp 98.4 F (36.9 C) (Oral)    Resp 16    Ht 5\' 8"  (1.727 m)    Wt 65.8 kg    LMP 01/12/2012    SpO2 99%    BMI 22.05 kg/m   Physical Exam Vitals signs and nursing note reviewed.  Constitutional:      General: She is not in acute distress.    Appearance: She is well-developed.  HENT:     Head: Normocephalic and atraumatic.  Eyes:     Pupils: Pupils are equal, round, and reactive to light.  Neck:     Musculoskeletal: Normal range of motion and neck supple.  Cardiovascular:     Rate and Rhythm: Normal rate and regular rhythm.     Heart sounds: Normal heart sounds. No murmur. No friction rub. No gallop.   Pulmonary:     Effort: Pulmonary effort is normal. No respiratory distress.     Breath sounds: Normal breath sounds. No wheezing.  Abdominal:     General: Bowel sounds are normal. There is no distension.     Palpations: Abdomen is soft.     Tenderness: There is generalized abdominal tenderness. There is no guarding or rebound.  Skin:    General: Skin is warm and dry.     Capillary Refill: Capillary refill takes less than 2 seconds.     Findings: No erythema or rash.  Neurological:     Mental Status: She is alert and oriented to person, place, and time.     Motor: No abnormal muscle tone.     Coordination: Coordination normal.  Psychiatric:        Behavior: Behavior normal.      ED Treatments / Results  Labs (all labs  ordered are listed, but only abnormal results are displayed) Labs Reviewed  COMPREHENSIVE METABOLIC PANEL - Abnormal; Notable for the following components:      Result Value   Sodium 131 (*)    CO2 20 (*)    Creatinine, Ser 1.05 (*)    Calcium 8.4 (*)    GFR calc non Af Amer 58 (*)    All other components within normal limits  CBC WITH DIFFERENTIAL/PLATELET - Abnormal; Notable for the following components:   RBC  3.86 (*)    All other components within normal limits  URINALYSIS, ROUTINE W REFLEX MICROSCOPIC - Abnormal; Notable for the following components:   APPearance HAZY (*)    Ketones, ur 20 (*)    All other components within normal limits  LIPASE, BLOOD    EKG None  Radiology Ct Abdomen Pelvis W Contrast  Result Date: 09/25/2018 CLINICAL DATA:  Abdominal swelling. Abdominal pain and constipation for 9 days. Nausea. EXAM: CT ABDOMEN AND PELVIS WITH CONTRAST TECHNIQUE: Multidetector CT imaging of the abdomen and pelvis was performed using the standard protocol following bolus administration of intravenous contrast. CONTRAST:  192mL OMNIPAQUE IOHEXOL 300 MG/ML  SOLN COMPARISON:  06/12/2015 FINDINGS: Lower chest: No acute abnormality. Hepatobiliary: Unremarkable Pancreas: Unremarkable Spleen: Unremarkable Adrenals/Urinary Tract: Adrenal glands are within normal limits. Stable appearance of the kidneys with prominence of the collecting system bilaterally. Bladder is within normal limits. Stomach/Bowel: No obvious mass in the colon. Small bowel is decompressed. Small hiatal hernia is noted. Vascular/Lymphatic: Minimal atherosclerotic vascular calcifications of the aorta. No abnormal retroperitoneal adenopathy by short axis diameter measurement criteria. Reproductive: Uterus is absent. Other: No free fluid. Musculoskeletal: Slight dextroscoliosis in the upper lumbar spine. No vertebral compression deformity. IMPRESSION: No acute intra-abdominal pathology. Electronically Signed   By: Marybelle Killings M.D.   On: 09/25/2018 15:50    Procedures Procedures (including critical care time)  Medications Ordered in ED Medications  sodium chloride 0.9 % bolus 1,000 mL (0 mLs Intravenous Stopped 09/25/18 1444)  iohexol (OMNIPAQUE) 300 MG/ML solution 100 mL (100 mLs Intravenous Contrast Given 09/25/18 1514)     Initial Impression / Assessment and Plan / ED Course  I have reviewed the triage vital signs and the nursing notes.  Pertinent labs & imaging results that were available during my care of the patient were reviewed by me and considered in my medical decision making (see chart for details).        I have signed the patient out to Marcene Brawn, PA-C.  She will await the patient CT scan results and make further determinations at that point.  Final Clinical Impressions(s) / ED Diagnoses   Final diagnoses:  Constipation, unspecified constipation type    ED Discharge Orders    None       Dalia Heading, PA-C 09/26/18 1512    Malvin Johns, MD 09/27/18 [phone]

## 2019-03-01 DIAGNOSIS — N644 Mastodynia: Secondary | ICD-10-CM | POA: Insufficient documentation

## 2019-03-01 DIAGNOSIS — M799 Soft tissue disorder, unspecified: Secondary | ICD-10-CM | POA: Insufficient documentation

## 2019-03-02 ENCOUNTER — Ambulatory Visit
Admission: RE | Admit: 2019-03-02 | Discharge: 2019-03-02 | Disposition: A | Discharge status: Home / Self Care | Payer: BC Managed Care – PPO | Source: Ambulatory Visit | Attending: Internal Medicine | Admitting: Internal Medicine

## 2019-03-02 ENCOUNTER — Other Ambulatory Visit: Payer: Self-pay | Admitting: Internal Medicine

## 2019-03-02 ENCOUNTER — Other Ambulatory Visit: Payer: Self-pay

## 2019-03-02 DIAGNOSIS — N6310 Unspecified lump in the right breast, unspecified quadrant: Secondary | ICD-10-CM

## 2019-04-12 DIAGNOSIS — L0889 Other specified local infections of the skin and subcutaneous tissue: Secondary | ICD-10-CM | POA: Insufficient documentation

## 2019-04-12 DIAGNOSIS — W57XXXA Bitten or stung by nonvenomous insect and other nonvenomous arthropods, initial encounter: Secondary | ICD-10-CM | POA: Insufficient documentation

## 2019-04-12 DIAGNOSIS — R202 Paresthesia of skin: Secondary | ICD-10-CM | POA: Insufficient documentation

## 2019-04-19 DIAGNOSIS — M26623 Arthralgia of bilateral temporomandibular joint: Secondary | ICD-10-CM | POA: Insufficient documentation

## 2019-05-04 ENCOUNTER — Encounter: Payer: Self-pay | Admitting: *Deleted

## 2019-05-05 ENCOUNTER — Encounter: Payer: Self-pay | Admitting: Neurology

## 2019-05-05 ENCOUNTER — Ambulatory Visit: Payer: BC Managed Care – PPO | Admitting: Neurology

## 2019-05-05 ENCOUNTER — Other Ambulatory Visit: Payer: Self-pay

## 2019-05-05 ENCOUNTER — Telehealth: Payer: Self-pay | Admitting: *Deleted

## 2019-05-05 VITALS — BP 129/83 | HR 76 | Temp 96.9°F | Ht 68.0 in | Wt 144.0 lb

## 2019-05-05 DIAGNOSIS — G501 Atypical facial pain: Secondary | ICD-10-CM | POA: Diagnosis not present

## 2019-05-05 NOTE — Telephone Encounter (Signed)
Called LM for Vicki Abbott in MR for Dr. Danna Hefty office to fax over lab results from 04-12-19 at [phone].

## 2019-05-05 NOTE — Progress Notes (Signed)
Subjective:    Patient ID: Vicki Abbott is a 58 y.o. female.  HPI     Star Age, MD, PhD [facility] [address], [address]O. [place], [place] [address]  Dear Dr. Dagmar Hait, I saw your patient, Vicki Abbott, upon your kind request in my neurologic clinic today for initial consultation of her facial paresthesias and facial pain.  The patient is unaccompanied today.  As you know, Vicki Abbott is a 58 year old right-handed woman with an underlying medical history of hypothyroidism, allergic rhinitis, interstitial cystitis, and TMJ arthritis, who reports an approximately 2-to 3 year history of left upper jaw pain and facial paresthesias.  Sometimes the pain shifts to the front teeth.  She reports that she had a hysterectomy in 2015.  She developed pelvic pain and neuropathy afterwards, she was given gabapentin at the time and went through pelvic physical therapy which improved eventually her symptoms.  In 2018, she had an adjustment of her tooth alignment, her front top teeth were buffed a little bit to allow for better bite.  She reports that she started having pain in the left upper jaw area, sometimes in the upper face, sometimes in the front tooth area.  The pain is dull, achy, not short-lived or stabbing or lightninglike, no obvious triggers such as chewing, swallowing, speaking, or toothbrushing.  She has seen several specialists including dentistry, orthodontist, dental surgeon, she has undergone biofeedback and has tried symptomatic medication for the paresthesias including gabapentin which was actually given to her for another issue, she tried Cymbalta, but had intolerable side effects including feeling sick and headache.  She has been using a retainer for years.  She has reduced.  She has tried muscle relaxers and nonsteroidal anti-inflammatory medication.  An MRI of the TMJ was considered with the last dentist.  She has not had it.  I reviewed your  office records.  She had blood work through your office including Lyme disease antibodies, TSH, free T4, T3, CBC with platelets, CMP, B12 level.  We will request test results from your office.  She has seen multiple specialists and several dentist and endodontist.  She has a follow-up with a TMJ specialist.  She has had dry needling. She denies any one-sided weakness or numbness or tingling or droopy face or slurring of speech otherwise.  She has no recurrent headaches.  She does not have a disabling pain but it is there all the time and nagging.  She does report stress and a tendency towards anxiety.  She has been treated for her anxiety for years, she has antidepressant medication, most recently on Wellbutrin and wanted to taper off of it, she has been off of it for the past 2 weeks but has not seen a flareup in her pain.  She is a retired Pharmacist, hospital.  She lost her mom in 2018 which increased her stress level quite a bit.  She has been on amitriptyline, low-dose.  She could not tolerate any higher dose, the amitriptyline has been helpful for her interstitial cystitis.  Her Past Medical History Is Significant For: Past Medical History:  Diagnosis Date  . Allergic rhinitis   . Anxiety   . Mood disorder ([place])   . PMDD (premenstrual dysphoric disorder)   . PONV (postoperative nausea and vomiting)   . Thyroid disorder 03/16/2012  . Venous insufficiency     Her Past Surgical History Is Significant For: Past Surgical History:  Procedure Laterality Date  . APPENDECTOMY    .  COLONOSCOPY    . DILATION AND CURETTAGE OF UTERUS    . LAPAROSCOPIC APPENDECTOMY  03/15/2012   Procedure: APPENDECTOMY LAPAROSCOPIC;  Surgeon: Zenovia Jarred, MD;  Location: [facility];  Service: General;  Laterality: N/A;  . MASS EXCISION Right 11/25/2013   Procedure: BIOPSY MASS RIGHT ELBOW;  Surgeon: Wynonia Sours, MD;  Location: [facility];  Service: Orthopedics;  Laterality: Right;  excision of mass antecubital fossa  right elbow  . TONSILLECTOMY    . TOTAL ABDOMINAL HYSTERECTOMY  2/15  . TUBAL LIGATION      Her Family History Is Significant For: Family History  Problem Relation Age of Onset  . Diabetes Mother   . Hypertension Mother   . Stroke Mother 13  . Hypertension Father        suicide  . Breast cancer Paternal Aunt 36    Her Social History Is Significant For: Social History   Socioeconomic History  . Marital status: Married    Spouse name: Not on file  . Number of children: 2  . Years of education: Not on file  . Highest education level: Not on file  Occupational History  . Not on file  Social Needs  . Financial resource strain: Not on file  . Food insecurity    Worry: Not on file    Inability: Not on file  . Transportation needs    Medical: Not on file    Non-medical: Not on file  Tobacco Use  . Smoking status: Never Smoker  . Smokeless tobacco: Never Used  Substance and Sexual Activity  . Alcohol use: Yes    Comment: occ  . Drug use: No  . Sexual activity: Not on file  Lifestyle  . Physical activity    Days per week: Not on file    Minutes per session: Not on file  . Stress: Not on file  Relationships  . Social Herbalist on phone: Not on file    Gets together: Not on file    Attends religious service: Not on file    Active member of club or organization: Not on file    Attends meetings of clubs or organizations: Not on file    Relationship status: Not on file  Other Topics Concern  . Not on file  Social History Narrative   Lives at home with husband and cat.  Education 4 yr college.      Her Allergies Are:  Allergies  Allergen Reactions  . Augmentin [Amoxicillin-Pot Clavulanate] Rash  . Penicillins Rash  . Sulfa Antibiotics Rash  :   Her Current Medications Are:  Outpatient Encounter Medications as of 05/05/2019  Medication Sig  . acetaminophen (TYLENOL) 325 MG tablet Take 2 tablets (650 mg total) by mouth every 4 (four) hours as needed.   Marland Kitchen amitriptyline (ELAVIL) 10 MG tablet Take 10 mg by mouth at bedtime.  Marland Kitchen ibuprofen (ADVIL) 200 MG tablet You can take 2-3 tablets every 6 hours as needed for pain.  Marland Kitchen levothyroxine (SYNTHROID, LEVOTHROID) 100 MCG tablet Take 100 mcg by mouth daily.  Marland Kitchen loratadine (CLARITIN) 10 MG tablet Take 10 mg by mouth daily as needed for allergies.   Marland Kitchen ondansetron (ZOFRAN-ODT) 4 MG disintegrating tablet Take 4 mg by mouth every 8 (eight) hours as needed for nausea or vomiting.  Marland Kitchen PREMARIN 1.25 MG tablet Take 1.25 mg by mouth daily.   Marland Kitchen gabapentin (NEURONTIN) 100 MG capsule Take 100 mg by mouth at bedtime.   . [  DISCONTINUED] ALPRAZolam (XANAX) 0.25 MG tablet Take by mouth.  . [DISCONTINUED] buPROPion (WELLBUTRIN XL) 300 MG 24 hr tablet Take 300 mg by mouth daily.  . [DISCONTINUED] escitalopram (LEXAPRO) 5 MG tablet Take 2.5 mg by mouth daily.   . [DISCONTINUED] HYDROcodone-acetaminophen (NORCO) 5-325 MG per tablet Take 1 tablet by mouth every 6 (six) hours as needed for moderate pain. (Patient not taking: Reported on 08/26/2016)  . [DISCONTINUED] linaclotide (LINZESS) 290 MCG CAPS capsule Take 290 mcg by mouth daily before breakfast.  . [DISCONTINUED] promethazine (PHENERGAN) 6.25 MG/5ML syrup Take 10 mLs (12.5 mg total) by mouth every 6 (six) hours as needed for nausea or vomiting. (Patient not taking: Reported on 09/25/2018)   No facility-administered encounter medications on file as of 05/05/2019.   :   Review of Systems:  Out of a complete 14 point review of systems, all are reviewed and negative with the exception of these symptoms as listed below: Review of Systems  Neurological:       Rm 1, alone.  Referred by Dr. Dagmar Hait. Paresthesia upper jaw, left face since 2018.  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 Resolved.  Had bite adjustment 2018 since then burning pain in jaw/face. Has seen multiple providers (Endodotist/ENT/had sleep study in [place]).  See's [place] [facility] next week.     Objective:  Neurological Exam  Physical Exam Physical Examination:   Vitals:   05/05/19 0917  BP: 129/83  Pulse: 76  Temp: (!) 96.9 F (36.1 C)    General Examination: The patient is a very pleasant 58 y.o. female in no acute distress. She appears well-developed and well-nourished and well groomed.   HEENT: Normocephalic, atraumatic, pupils are equal, round and reactive to light and accommodation. Funduscopic exam is normal with sharp disc margins noted. Extraocular tracking is good without limitation to gaze excursion or nystagmus noted. Normal smooth pursuit is noted. Hearing is grossly intact. Tympanic membranes are clear bilaterally, Mild dryness noted in the right ear canal. Face is symmetric with normal facial animation and normal facial sensation. Speech is clear with no dysarthria noted. There is no hypophonia. There is no lip, neck/head, jaw or voice tremor. Neck is supple with full range of passive and active motion. There are no carotid bruits on auscultation. Oropharynx exam reveals: mild mouth dryness, good dental hygiene, No obvious swelling or redness in the gum areas or gumline, no sores.  Tongue protrudes centrally and palate elevates symmetrically.  Chest: Clear to auscultation without wheezing, rhonchi or crackles noted.  Heart: S1+S2+0, regular and normal without murmurs, rubs or gallops noted.   Abdomen: Soft, non-tender and non-distended with normal bowel sounds appreciated on auscultation.  Extremities: There is no pitting edema in the distal lower extremities bilaterally.   Skin: Warm and dry without trophic changes noted.  Musculoskeletal: exam reveals no obvious joint deformities, tenderness or joint swelling or erythema.   Neurologically:  Mental status: The patient is awake, alert and oriented in all 4 spheres. Her immediate and remote memory, attention, language skills and fund of knowledge are appropriate. There is no  evidence of aphasia, agnosia, apraxia or anomia. Speech is clear with normal prosody and enunciation. Thought process is linear. Mood is normal and affect is normal.  Cranial nerves II - XII are as described above under HEENT exam. In addition: shoulder shrug is normal with equal shoulder height noted. Motor exam: Normal bulk, strength and tone is noted. There is no drift, tremor or rebound.  Romberg is negative. Reflexes are 2+ throughout. Babinski: Toes are flexor bilaterally. Fine motor skills and coordination: intact with normal finger taps, normal hand movements, normal rapid alternating patting, normal foot taps and normal foot agility.  Cerebellar testing: No dysmetria or intention tremor on finger to nose testing. Heel to shin is unremarkable bilaterally. There is no truncal or gait ataxia.  Sensory exam: intact to light touch in the upper and lower extremities.  Gait, station and balance: She stands easily. No veering to one side is noted. No leaning to one side is noted. Posture is age-appropriate and stance is narrow based. Gait shows normal stride length and normal pace. No problems turning are noted. Tandem walk is unremarkable.  Assessment and Plan:   In summary, Vicki Abbott is a very pleasant 58 y.o.-year old female Who presents for evaluation of her left facial discomfort.  Her history is not telltale for trigeminal neuralgia.  She has a nonfocal neurological exam and is largely reassured.  She has had evaluation through dentistry, endodontist and TMJ specialty.  She has undergone symptomatic medical management and has tried Cymbalta as well as a muscle relaxer.  She has some gabapentin left over and would be willing to retry it.  I would not favor trying carbamazepine quite yet because her symptoms are not classic for trigeminal neuralgia.  She may have atypical facial pain.  Given that she has not had any brain scan, I would recommend a brain MRI with special attention to the  trigeminal nerve, with and without contrast.  She is agreeable.  She had some recent blood work through your office and I was able to review it after the visit was completed.  She hadA B12 level of 882, Lyme antibody test was negative, CMP showed BUN of 26, creatinine 0.9, glucose 89, otherwise unremarkable, CBC with differential showed benign findings, free T4 and total T3 were normal, TSH normal at 3.9 She is encouraged to follow-up with her TMJ specialist, she has an upcoming appointment.  She has had physical therapy with dry needling.  I am not quite sure that I can be of any further assistance other than trying the gabapentin again.  We will certainly keep her posted as to her facial MRI results.  If there is any evidence of left trigeminal nerve irritation, we can certainly consider a referral to a neurosurgeon for consideration of gamma knife.  I talked to her a little bit about this and we will certainly keep her posted as to her MRI results by phone call.  I answered all her questions today and she was in agreement with the plan. Thank you very much for allowing me to participate in the care of this nice patient. If I can be of any further assistance to you please do not hesitate to call me at [phone].  Sincerely,   Star Age, MD, PhD

## 2019-05-05 NOTE — Patient Instructions (Signed)
Your history is not suggestive of trigeminal neuralgia.  Thankfully, your neurological exam is normal.  Nevertheless, I think it would be helpful to seek a trigeminal/facial MRI, I will order this and we will schedule at your convenience, once we have insurance authorization.  We will call you with the results by phone call and I plan to see you back as needed.  As discussed, you could try gabapentin again, start with 100 mg strength and once daily, build up to 1 pill 3 times daily.  You have leftover gabapentin at home.  If you need a prescription, you could ask Dr. Dagmar Hait for renewal or call us back.I think it would be helpful to keep the TMJ Specialist follow-up appointment.

## 2019-05-16 ENCOUNTER — Telehealth: Payer: Self-pay

## 2019-05-16 NOTE — Telephone Encounter (Signed)
Per [employer], MRI Face/Trigeminal w/wo contrast is not approved because  "Clinical appropriateness cannot be determined in the absence of a differential diagnosis or a symptom or sign for which advanced imaging of the orbit, face, and soft tissues of the neck may be indicated."  You may complete a peer to peer before 05/18/19.

## 2019-05-17 NOTE — Telephone Encounter (Signed)
Received, placed in Dr. Guadelupe Sabin inbox.

## 2019-05-19 NOTE — Telephone Encounter (Signed)
I called pt to discuss. No answer, left a message asking her to call me back. 

## 2019-05-19 NOTE — Telephone Encounter (Signed)
Pt returned my call. I discussed this with her. Pt will discuss the MRI with her TMJ specialist. Pt will follow up with [facility] PRN Pt verbalized understanding of recommendations.

## 2019-05-19 NOTE — Telephone Encounter (Signed)
Please advise pt that her facial/trigem. MRI was denied. She may be able to get a TMJ MRI approved through her TMJ specialist. For now, since she had a normal neuro exam, we can continue to observe her Sx and she can FU with me prn.

## 2019-07-25 DIAGNOSIS — F419 Anxiety disorder, unspecified: Secondary | ICD-10-CM | POA: Insufficient documentation

## 2019-08-22 ENCOUNTER — Other Ambulatory Visit: Payer: Self-pay | Admitting: Physician Assistant

## 2019-08-22 ENCOUNTER — Encounter: Payer: Self-pay | Admitting: Physician Assistant

## 2019-08-22 DIAGNOSIS — N183 Chronic kidney disease, stage 3 unspecified: Secondary | ICD-10-CM | POA: Insufficient documentation

## 2019-08-22 DIAGNOSIS — N1831 Chronic kidney disease, stage 3a: Secondary | ICD-10-CM

## 2019-08-22 DIAGNOSIS — I700 Atherosclerosis of aorta: Secondary | ICD-10-CM | POA: Insufficient documentation

## 2019-08-22 DIAGNOSIS — U071 COVID-19: Secondary | ICD-10-CM

## 2019-08-22 MED ORDER — SODIUM CHLORIDE 0.9 % IV SOLN
700.0000 mg | Freq: Once | INTRAVENOUS | Status: AC
  Administered 2019-08-23: 09:00:00 700 mg via INTRAVENOUS
  Filled 2019-08-22: qty 700

## 2019-08-22 NOTE — Progress Notes (Signed)
  I connected by phone with Vicki Abbott on 08/22/2019 at 11:29 AM to discuss the potential use of an new treatment for mild to moderate COVID-19 viral infection in non-hospitalized patients.  This patient is a 59 y.o. female that meets the FDA criteria for Emergency Use Authorization of bamlanivimab or casirivimab\imdevimab.  Has a (+) direct SARS-CoV-2 viral test result  Has mild or moderate COVID-19   Is ? 61 years of age and weighs ? 40 kg  Is NOT hospitalized due to COVID-19  Is NOT requiring oxygen therapy or requiring an increase in baseline oxygen flow rate due to COVID-19  Is within 10 days of symptom onset  Has at least one of the high risk factor(s) for progression to severe COVID-19 and/or hospitalization as defined in EUA.  Specific high risk criteria : Chronic Kidney Disease (CKD) and 28 + CVD   I have spoken and communicated the following to the patient or parent/caregiver:  1. FDA has authorized the emergency use of bamlanivimab and casirivimab\imdevimab for the treatment of mild to moderate COVID-19 in adults and pediatric patients with positive results of direct SARS-CoV-2 viral testing who are 56 years of age and older weighing at least 40 kg, and who are at high risk for progressing to severe COVID-19 and/or hospitalization.  2. The significant known and potential risks and benefits of bamlanivimab and casirivimab\imdevimab, and the extent to which such potential risks and benefits are unknown.  3. Information on available alternative treatments and the risks and benefits of those alternatives, including clinical trials.  4. Patients treated with bamlanivimab and casirivimab\imdevimab should continue to self-isolate and use infection control measures (e.g., wear mask, isolate, social distance, avoid sharing personal items, clean and disinfect "high touch" surfaces, and frequent handwashing) according to CDC guidelines.   5. The patient or  parent/caregiver has the option to accept or refuse bamlanivimab or casirivimab\imdevimab .  After reviewing this information with the patient, The patient agreed to proceed with receiving the bamlanimivab infusion and will be provided a copy of the Fact sheet prior to receiving the infusion.   Sx onset 3/16. Infusion set up for 08/23/19 @ 9:30am. Directions given and orders in.   [name] Form 08/22/2019 11:29 AM

## 2019-08-23 ENCOUNTER — Ambulatory Visit (HOSPITAL_COMMUNITY)
Admission: RE | Admit: 2019-08-23 | Discharge: 2019-08-23 | Disposition: A | Discharge status: Home / Self Care | Payer: BC Managed Care – PPO | Source: Ambulatory Visit | Attending: Pulmonary Disease | Admitting: Pulmonary Disease

## 2019-08-23 DIAGNOSIS — N1831 Chronic kidney disease, stage 3a: Secondary | ICD-10-CM | POA: Insufficient documentation

## 2019-08-23 DIAGNOSIS — I700 Atherosclerosis of aorta: Secondary | ICD-10-CM

## 2019-08-23 DIAGNOSIS — U071 COVID-19: Secondary | ICD-10-CM | POA: Insufficient documentation

## 2019-08-23 MED ORDER — FAMOTIDINE IN NACL 20-0.9 MG/50ML-% IV SOLN: 20.0000 mg | Freq: Once | INTRAVENOUS | Status: DC | PRN

## 2019-08-23 MED ORDER — DIPHENHYDRAMINE HCL 50 MG/ML IJ SOLN: 50.0000 mg | Freq: Once | INTRAMUSCULAR | Status: DC | PRN

## 2019-08-23 MED ORDER — ALBUTEROL SULFATE HFA 108 (90 BASE) MCG/ACT IN AERS: 2.0000 | INHALATION_SPRAY | Freq: Once | RESPIRATORY_TRACT | Status: DC | PRN

## 2019-08-23 MED ORDER — METHYLPREDNISOLONE SODIUM SUCC 125 MG IJ SOLR: 125.0000 mg | Freq: Once | INTRAMUSCULAR | Status: DC | PRN

## 2019-08-23 MED ORDER — SODIUM CHLORIDE 0.9 % IV SOLN
INTRAVENOUS | Status: DC | PRN
  Administered 2019-08-23: 250 mL via INTRAVENOUS

## 2019-08-23 MED ORDER — EPINEPHRINE 0.3 MG/0.3ML IJ SOAJ: 0.3000 mg | Freq: Once | INTRAMUSCULAR | Status: DC | PRN

## 2019-08-23 NOTE — Progress Notes (Signed)
  Diagnosis: COVID-19  Physician: Asencion Noble  Procedure: [facility] Med: bamlanivimab infusion - Provided patient with bamlanimivab fact sheet for patients, parents and caregivers prior to infusion.  Complications: No immediate complications noted.  Discharge: Discharged home   [place], [place] C 08/23/2019

## 2019-08-23 NOTE — Discharge Instructions (Signed)

## 2019-09-28 DIAGNOSIS — U071 COVID-19: Secondary | ICD-10-CM | POA: Insufficient documentation

## 2020-02-24 ENCOUNTER — Other Ambulatory Visit: Payer: Self-pay | Admitting: Internal Medicine

## 2020-02-24 DIAGNOSIS — E785 Hyperlipidemia, unspecified: Secondary | ICD-10-CM

## 2020-04-02 ENCOUNTER — Telehealth: Payer: Self-pay | Admitting: Neurology

## 2020-04-02 NOTE — Telephone Encounter (Signed)
I am not sure that I have anymore to offer patient. I would sugest Dr. Rexene Alberts or her pcp refer her as a second opinion to a different neurology group, [facility] or [facility] have excellent centers that treat trigeminal neuralgia thanks

## 2020-04-02 NOTE — Telephone Encounter (Signed)
Please review earlier phone notes from today.  Please relay information back to patient and advise her that Dr. Jaynee Eagles has reviewed her chart, and did not think she had anything additional to add.  If the patient would like to seek a second opinion, I recommend that she follow-up with her primary care physician to seek a referral.

## 2020-04-02 NOTE — Telephone Encounter (Signed)
Pt. Feels Dr. Jaynee Eagles specializes in what she is needing to be seen for. She is requesting to be seen by Dr. Jaynee Eagles instead of Dr. Rexene Alberts.

## 2020-04-02 NOTE — Telephone Encounter (Signed)
Okay with me 

## 2020-04-02 NOTE — Telephone Encounter (Signed)
I called pt. I discussed this with her. She will discuss this further will Dr. Dagmar Hait and her TMJ specialist and follow up with [facility].

## 2020-04-03 NOTE — Telephone Encounter (Signed)
See other phone note. Pt has been notified.

## 2022-02-12 ENCOUNTER — Ambulatory Visit: Payer: BC Managed Care – PPO | Admitting: Internal Medicine

## 2022-02-12 ENCOUNTER — Encounter: Payer: Self-pay | Admitting: Internal Medicine

## 2022-02-12 VITALS — Temp 97.8°F | Resp 16 | Ht 68.0 in | Wt 159.0 lb

## 2022-02-12 DIAGNOSIS — E039 Hypothyroidism, unspecified: Secondary | ICD-10-CM | POA: Insufficient documentation

## 2022-02-12 DIAGNOSIS — R42 Dizziness and giddiness: Secondary | ICD-10-CM

## 2022-02-12 DIAGNOSIS — R002 Palpitations: Secondary | ICD-10-CM | POA: Insufficient documentation

## 2022-02-12 DIAGNOSIS — E785 Hyperlipidemia, unspecified: Secondary | ICD-10-CM | POA: Insufficient documentation

## 2022-02-12 NOTE — Progress Notes (Signed)
Primary Physician/Referring:  Prince Solian, MD  Patient ID: Dimas Aguas, female    DOB: 12/22/1960, 61 y.o.   MRN: [id number]  Chief Complaint  Patient presents with   New Patient (Initial Visit)   Palpitations   Referral   HPI:    Hetvi Shawhan  is a 61 y.o. female with history of hyperlipidemia, chronic kidney disease stage 3, anxiety, and hypothyroidism. She is referred to cardiology for palpitations. Palpitations are occurring 2-3 times per week for the past 5-6 months and last from a few seconds to a few minutes. She does endorse mild shortness of breath and dizziness during this episodes but denies chest pain, nausea. She exercises at the gym 3 times per week. She follows a healthy diet. She does endorse moderate fatigue after completing yard work but does not experience this after exercising at the gym. She is complaint with thyroid medication and last TSH was 1.62.  She has been dealing with jaw pain of unknown etiology for the past 6 years and has seen dentist, neurologist, and primary care doctor. She was started on several different medications including antidepressants. She has undergone imaging workup, dental procedures, and been seen by a chiropractor. She has concern that jaw pain could be cardiac in nature. She denies pain radiation to arm or back.  Past Medical History:  Diagnosis Date   Allergic rhinitis    Anxiety    Atherosclerosis of aorta (HCC)    CKD (chronic kidney disease) stage 3, GFR 30-59 ml/min (HCC)    Mood disorder (HCC)    PMDD (premenstrual dysphoric disorder)    PONV (postoperative nausea and vomiting)    Thyroid disorder 03/16/2012   Venous insufficiency    Past Surgical History:  Procedure Laterality Date   APPENDECTOMY     COLONOSCOPY     DILATION AND CURETTAGE OF UTERUS     LAPAROSCOPIC APPENDECTOMY  03/15/2012   Procedure: APPENDECTOMY LAPAROSCOPIC;  Surgeon: Zenovia Jarred, MD;  Location: [facility];  Service:  General;  Laterality: N/A;   MASS EXCISION Right 11/25/2013   Procedure: BIOPSY MASS RIGHT ELBOW;  Surgeon: Wynonia Sours, MD;  Location: [facility];  Service: Orthopedics;  Laterality: Right;  excision of mass antecubital fossa right elbow   TONSILLECTOMY     TOTAL ABDOMINAL HYSTERECTOMY  2/15   TUBAL LIGATION     Family History  Problem Relation Age of Onset   Diabetes Mother    Hypertension Mother    Stroke Mother 68   Hypertension Father        suicide   Breast cancer Paternal Aunt 22    Social History   Tobacco Use   Smoking status: Never   Smokeless tobacco: Never  Substance Use Topics   Alcohol use: Yes    Comment: occ   Marital Status: Married  ROS  Review of Systems  Constitutional: Negative for malaise/fatigue.  Cardiovascular:  Positive for palpitations. Negative for chest pain, dyspnea on exertion, leg swelling and near-syncope.  Respiratory:  Positive for shortness of breath (with palpitations).    Objective  Temperature 97.8 F (36.6 C), temperature source Temporal, resp. rate 16, height '5\' 8"'$  (1.727 m), weight 159 lb (72.1 kg), last menstrual period 01/12/2012, SpO2 99 %. Body mass index is 24.18 kg/m.     02/12/2022    2:26 PM 08/23/2019   10:36 AM 08/23/2019    9:14 AM  Vitals with BMI  Height '5\' 8"'$     Weight 159  lbs    BMI 10.93    Systolic  235 573  Diastolic  70 67  Pulse  79 101     Physical Exam Constitutional:      Appearance: Normal appearance.  Cardiovascular:     Rate and Rhythm: Normal rate and regular rhythm.     Pulses: Normal pulses.          Carotid pulses are 2+ on the right side and 2+ on the left side.      Radial pulses are 2+ on the right side and 2+ on the left side.       Femoral pulses are 2+ on the right side and 2+ on the left side.      Popliteal pulses are 2+ on the right side and 2+ on the left side.       Dorsalis pedis pulses are 2+ on the right side and 2+ on the left side.       Posterior tibial  pulses are 2+ on the right side and 2+ on the left side.     Heart sounds: Normal heart sounds, S1 normal and S2 normal. No murmur heard. Pulmonary:     Effort: Pulmonary effort is normal.     Breath sounds: Normal breath sounds.  Abdominal:     General: Bowel sounds are normal.     Palpations: Abdomen is soft.  Musculoskeletal:     Right lower leg: No edema.     Left lower leg: No edema.  Neurological:     Mental Status: She is alert.    Medications and allergies   Allergies  Allergen Reactions   Augmentin [Amoxicillin-Pot Clavulanate] Rash   Penicillins Rash   Sulfa Antibiotics Rash     Medication list after today's encounter   Current Outpatient Medications:    acetaminophen (TYLENOL) 325 MG tablet, Take 2 tablets (650 mg total) by mouth every 4 (four) hours as needed., Disp: , Rfl:    ALPRAZolam (XANAX) 0.5 MG tablet, Take 0.25 mg by mouth every 6 (six) hours as needed., Disp: , Rfl:    desvenlafaxine (PRISTIQ) 50 MG 24 hr tablet, Take 50 mg by mouth daily., Disp: , Rfl:    estradiol (ESTRACE) 2 MG tablet, Take 1 tablet by mouth daily., Disp: , Rfl:    ibuprofen (ADVIL) 200 MG tablet, You can take 2-3 tablets every 6 hours as needed for pain., Disp: 30 tablet, Rfl: 0   levothyroxine (SYNTHROID) 112 MCG tablet, Take 112 mcg by mouth daily., Disp: , Rfl:    loratadine (CLARITIN) 10 MG tablet, Take 10 mg by mouth daily as needed for allergies. , Disp: , Rfl:    ondansetron (ZOFRAN-ODT) 4 MG disintegrating tablet, Take 4 mg by mouth every 8 (eight) hours as needed for nausea or vomiting., Disp: , Rfl:   Laboratory examination:   Lab Results  Component Value Date   NA 131 (L) 09/25/2018   K 4.3 09/25/2018   CO2 20 (L) 09/25/2018   GLUCOSE 95 09/25/2018   BUN 12 09/25/2018   CREATININE 1.05 (H) 09/25/2018   CALCIUM 8.4 (L) 09/25/2018   GFRNONAA 58 (L) 09/25/2018       Latest Ref Rng & Units 09/25/2018    1:57 PM 04/17/2018    7:03 PM 08/30/2014    3:50 AM  CMP   Glucose 70 - 99 mg/dL 95  101  101   BUN 6 - 20 mg/dL '12  22  16   '$ Creatinine 0.44 -  1.00 mg/dL 1.05  1.27  0.86   Sodium 135 - 145 mmol/L 131  135  139   Potassium 3.5 - 5.1 mmol/L 4.3  3.8  4.0   Chloride 98 - 111 mmol/L 99  102  104   CO2 22 - 32 mmol/L '20  25  27   '$ Calcium 8.9 - 10.3 mg/dL 8.4  9.0  9.3   Total Protein 6.5 - 8.1 g/dL 6.5     Total Bilirubin 0.3 - 1.2 mg/dL 0.3     Alkaline Phos 38 - 126 U/L 43     AST 15 - 41 U/L 21     ALT 0 - 44 U/L 15         Latest Ref Rng & Units 09/25/2018    1:57 PM 04/17/2018    7:03 PM 08/30/2014    3:50 AM  CBC  WBC 4.0 - 10.5 K/uL 6.5  4.7  5.4   Hemoglobin 12.0 - 15.0 g/dL 12.6  12.4  13.2   Hematocrit 36.0 - 46.0 % 37.6  37.6  39.6   Platelets 150 - 400 K/uL 271  243  243     Lipid Panel No results for input(s): "CHOL", "TRIG", "[place]", "VLDL", "HDL", "CHOLHDL", "LDLDIRECT" in the last 8760 hours.  HEMOGLOBIN A1C No results found for: "HGBA1C", "MPG"  External labs:   08/28/2021: CHOLESTEROL 247 100 - 200 mg/dl   TRIGLYCERIDES 216 30 - 149 mg/dl   HDL 78 40 - 60 mg/dl   LDL 126 < mg/dl   CHOL/HDL RATIO 3.2 < RATIO   LDL/HDL RATIO 1.6 1.7 - 2.5   NON-HDL 169.0     TSH 1.62  Radiology:    Cardiac Studies:   No results found for this or any previous visit from the past 1095 days.     No results found for this or any previous visit from the past 1095 days.   EKG:   02/12/2022: Normal sinus rhythm at a rate of 67 bpm. Normal axis.   Assessment     ICD-10-CM   1. Palpitations  R00.2 EKG 12-Lead    LONG TERM MONITOR (3-14 DAYS)       Orders Placed This Encounter  Procedures   LONG TERM MONITOR (3-14 DAYS)    Standing Status:   Future    Number of Occurrences:   1    Standing Expiration Date:   02/13/2023    Order Specific Question:   Where should this test be performed?    Answer:   [facility]    Order Specific Question:   Does the patient have an implanted cardiac device?    Answer:   No     Order Specific Question:   Prescribed days of wear    Answer:   51    Order Specific Question:   Type of enrollment    Answer:   Clinic Enrollment    Order Specific Question:   Release to patient    Answer:   Immediate   EKG 12-Lead    No orders of the defined types were placed in this encounter.   Medications Discontinued During This Encounter  Medication Reason   amitriptyline (ELAVIL) 10 MG tablet    gabapentin (NEURONTIN) 100 MG capsule    levothyroxine (SYNTHROID, LEVOTHROID) 100 MCG tablet    PREMARIN 1.25 MG tablet      Recommendations:   Palpitations Long term event monitor (14 days) Continue current medications, prior labs reviewed. Decrease caffeine intake and  encouraged increasing water intake. Continue diet and exercise  Do not feel that jaw pain has cardiac etiology. She is still following with dentist and PCP regarding this issue.  Follow-up in 1 month or sooner if needed.   Ernst Spell, NP  02/12/2022, 3:10 PM Office: [phone]

## 2022-02-13 ENCOUNTER — Telehealth: Payer: Self-pay | Admitting: Internal Medicine

## 2022-02-13 NOTE — Telephone Encounter (Signed)
Patient states at previous appointment, you told her she could schedule an appt for a heart monitor, but if she changed her mind, she could cancel her appt. She has decided to cancel her appt for a monitor 02/17/22 and will still plan to follow up w/you in October, as long as you would like to still see her back.

## 2022-02-13 NOTE — Telephone Encounter (Signed)
That is not exactly what she was told! But, that's ok if she does not want the heart monitor. We will see her back in October or before is she is having symptoms or needs to be seen!

## 2022-02-17 DIAGNOSIS — R002 Palpitations: Secondary | ICD-10-CM

## 2022-03-19 ENCOUNTER — Ambulatory Visit: Payer: BC Managed Care – PPO | Admitting: Internal Medicine

## 2022-09-18 ENCOUNTER — Other Ambulatory Visit: Payer: Self-pay | Admitting: Internal Medicine

## 2022-09-18 DIAGNOSIS — E785 Hyperlipidemia, unspecified: Secondary | ICD-10-CM

## 2022-10-06 ENCOUNTER — Ambulatory Visit: Payer: BC Managed Care – PPO | Admitting: Podiatry

## 2022-10-20 ENCOUNTER — Ambulatory Visit: Payer: Self-pay | Admitting: Podiatry

## 2022-11-10 ENCOUNTER — Ambulatory Visit (INDEPENDENT_AMBULATORY_CARE_PROVIDER_SITE_OTHER): Payer: BC Managed Care – PPO

## 2022-11-10 ENCOUNTER — Encounter: Payer: Self-pay | Admitting: Podiatry

## 2022-11-10 ENCOUNTER — Ambulatory Visit: Payer: BC Managed Care – PPO | Admitting: Podiatry

## 2022-11-10 DIAGNOSIS — M7662 Achilles tendinitis, left leg: Secondary | ICD-10-CM

## 2022-11-10 DIAGNOSIS — K5900 Constipation, unspecified: Secondary | ICD-10-CM | POA: Insufficient documentation

## 2022-11-10 DIAGNOSIS — Z1211 Encounter for screening for malignant neoplasm of colon: Secondary | ICD-10-CM | POA: Insufficient documentation

## 2022-11-10 DIAGNOSIS — R1032 Left lower quadrant pain: Secondary | ICD-10-CM | POA: Insufficient documentation

## 2022-11-10 MED ORDER — METHYLPREDNISOLONE 4 MG PO TBPK: ORAL_TABLET | ORAL | 0 refills | Status: AC

## 2022-11-10 MED ORDER — MELOXICAM 15 MG PO TABS: 15.0000 mg | ORAL_TABLET | Freq: Every day | ORAL | 1 refills | Status: AC

## 2022-11-10 MED ORDER — BETAMETHASONE SOD PHOS & ACET 6 (3-3) MG/ML IJ SUSP
3.0000 mg | Freq: Once | INTRAMUSCULAR | Status: AC
  Administered 2022-11-10: 3 mg via INTRA_ARTICULAR

## 2022-11-10 NOTE — Progress Notes (Signed)
Chief Complaint  Patient presents with   Foot Pain    Posterior heel left - aching x 3 months, injury to the heel a year or so ago, still has a red spot from the injury, noticed some swelling, feels like a bruise with pressure, but when she walks, it pulls, tried stretching with her normal exercise routine    New Patient (Initial Visit)    HPI: 62 y.o. female presenting today as a new patient for evaluation of pain and tenderness associated to the left posterior heel ongoing for about a year now.  Initially the patient states that she sustained a small laceration to the posterior aspect of the heel about a year ago.  It eventually healed but she has had continued pain and tenderness associated to the posterior tubercle of the calcaneus ever since that initial injury.  She is not ready for anything for treatment.  Past Medical History:  Diagnosis Date   Allergic rhinitis    Anxiety    Atherosclerosis of aorta (HCC)    CKD (chronic kidney disease) stage 3, GFR 30-59 ml/min (HCC)    Mood disorder (HCC)    PMDD (premenstrual dysphoric disorder)    PONV (postoperative nausea and vomiting)    Thyroid disorder 03/16/2012   Venous insufficiency     Past Surgical History:  Procedure Laterality Date   APPENDECTOMY     COLONOSCOPY     DILATION AND CURETTAGE OF UTERUS     LAPAROSCOPIC APPENDECTOMY  03/15/2012   Procedure: APPENDECTOMY LAPAROSCOPIC;  Surgeon: [name], MD;  Location[facility];  Service: General;  Laterality: N/A;   MASS EXCISION Right 11/25/2013   Procedure: BIOPSY MASS RIGHT ELBOW;  Surgeon: Nicki Reaper, MD;  Location: [facility];  Service: Orthopedics;  Laterality: Right;  excision of mass antecubital fossa right elbow   TONSILLECTOMY     TOTAL ABDOMINAL HYSTERECTOMY  2/15   TUBAL LIGATION      Allergies  Allergen Reactions   Duloxetine Hcl     Other Reaction(s): vomiting; headache   Augmentin [Amoxicillin-Pot Clavulanate] Rash   Penicillins Rash    Sulfa Antibiotics Rash     Physical Exam: General: The patient is alert and oriented x3 in no acute distress.  Dermatology: Skin is warm, dry and supple bilateral lower extremities. Negative for open lesions or macerations.  Vascular: Palpable pedal pulses bilaterally. No edema or erythema noted. Capillary refill within normal limits.  Neurological: Grossly intact via light touch  Musculoskeletal Exam: Pain on palpation noted to the posterior tubercle of the left posterior calcaneus at the insertion of the Achilles tendon consistent with retrocalcaneal bursitis. Range of motion within normal limits. Muscle strength 5/5 in all muscle groups bilateral lower extremities.  Radiographic Exam LT foot 11/10/2022:  Normal osseous mineralization.  Joint spaces preserved.  No fracture or dislocation noted.  Impression: Negative  Assessment: 1. Insertional Achilles tendinitis left  Plan of Care:  1. Patient was evaluated. Radiographs were reviewed today. 2. Injection of 0.5 mL Celestone Soluspan injected into the retrocalcaneal bursa. Care was taken to avoid direct injection into the Achilles tendon. 3.  Prescription for Medrol Dosepak 4.  Prescription for meloxicam 15 mg daily 5.  Recommend good supportive tennis shoes and sneakers 6.  Recommend light daily stretching exercises 7.  Return to clinic as needed   Vicki Abbott, DPM [facility]  Dr. Felecia Abbott, DPM    [address]  [place], [place] [address]                Office [phone]  Fax [phone]

## 2023-01-19 ENCOUNTER — Ambulatory Visit: Payer: BC Managed Care – PPO | Admitting: Podiatry

## 2023-01-19 ENCOUNTER — Encounter: Payer: Self-pay | Admitting: Podiatry

## 2023-01-19 DIAGNOSIS — M7662 Achilles tendinitis, left leg: Secondary | ICD-10-CM

## 2023-01-19 MED ORDER — BETAMETHASONE SOD PHOS & ACET 6 (3-3) MG/ML IJ SUSP
3.0000 mg | Freq: Once | INTRAMUSCULAR | Status: AC
  Administered 2023-01-19: 3 mg via INTRA_ARTICULAR

## 2023-01-19 NOTE — Progress Notes (Signed)
Chief Complaint  Patient presents with   Foot Pain    Follow up achilles tendonitis left   "I had an injection and it really helped but I was exercising and flared it up again. I am going on a big trip to [place], can I get another injection?"    HPI: 62 y.o. female presenting today for follow-up evaluation of left posterior heel pain.  Patient states that she was doing well and has no pain at all until she was exercising doing a jump rope.  The jump rope exercise created some tenderness to the posterior heel.  This was a few weeks ago and over the last few weeks there has been some improvement but she is nervous because she is leaving on a trip to [place].  Requesting injection today.  She says that the injection alleviated her symptoms 100%.  Past Medical History:  Diagnosis Date   Allergic rhinitis    Anxiety    Atherosclerosis of aorta (HCC)    CKD (chronic kidney disease) stage 3, GFR 30-59 ml/min (HCC)    Mood disorder (HCC)    PMDD (premenstrual dysphoric disorder)    PONV (postoperative nausea and vomiting)    Thyroid disorder 03/16/2012   Venous insufficiency     Past Surgical History:  Procedure Laterality Date   APPENDECTOMY     COLONOSCOPY     DILATION AND CURETTAGE OF UTERUS     LAPAROSCOPIC APPENDECTOMY  03/15/2012   Procedure: APPENDECTOMY LAPAROSCOPIC;  Surgeon: [name], MD;  Location: [facility];  Service: General;  Laterality: N/A;   MASS EXCISION Right 11/25/2013   Procedure: BIOPSY MASS RIGHT ELBOW;  Surgeon: Nicki Reaper, MD;  [facility];  Service: Orthopedics;  Laterality: Right;  excision of mass antecubital fossa right elbow   TONSILLECTOMY     TOTAL ABDOMINAL HYSTERECTOMY  2/15   TUBAL LIGATION      Allergies  Allergen Reactions   Duloxetine Hcl     Other Reaction(s): vomiting; headache   Augmentin [Amoxicillin-Pot Clavulanate] Rash   Penicillins Rash   Sulfa Antibiotics Rash     Physical Exam: General: The patient is  alert and oriented x3 in no acute distress.  Dermatology: Skin is warm, dry and supple bilateral lower extremities. Negative for open lesions or macerations.  Vascular: Palpable pedal pulses bilaterally. No edema or erythema noted. Capillary refill within normal limits.  Neurological: Grossly intact via light touch  Musculoskeletal Exam: Tenderness on palpation noted to the posterior tubercle of the left posterior calcaneus at the insertion of the Achilles tendon consistent with retrocalcaneal bursitis. Range of motion within normal limits. Muscle strength 5/5 in all muscle groups bilateral lower extremities.  Radiographic Exam LT foot 11/10/2022:  Normal osseous mineralization.  Joint spaces preserved.  No fracture or dislocation noted.  Impression: Negative  Assessment: 1. Insertional Achilles tendinitis left  Plan of Care:  -Patient was evaluated. . -Injection of 0.5 cc Celestone Soluspan injected around the lateral aspect of the left posterior heel.  Care taken to avoid injection into the Achilles tendon -Continue meloxicam 15 mg daily as needed -Advised against any high impact exercises such as jump rope that can create extra stress to the Achilles -Return to clinic as needed  Felecia Shelling, DPM [facility]  Dr. Felecia Shelling, DPM    [address]  [place], [place] [address]                Office [phone]  Fax [phone]

## 2024-02-15 DIAGNOSIS — Z85828 Personal history of other malignant neoplasm of skin: Secondary | ICD-10-CM | POA: Diagnosis not present

## 2024-02-15 DIAGNOSIS — L570 Actinic keratosis: Secondary | ICD-10-CM | POA: Diagnosis not present

## 2024-02-15 DIAGNOSIS — L720 Epidermal cyst: Secondary | ICD-10-CM | POA: Diagnosis not present

## 2024-02-15 DIAGNOSIS — L821 Other seborrheic keratosis: Secondary | ICD-10-CM | POA: Diagnosis not present

## 2024-02-15 DIAGNOSIS — L814 Other melanin hyperpigmentation: Secondary | ICD-10-CM | POA: Diagnosis not present

## 2024-02-15 DIAGNOSIS — L820 Inflamed seborrheic keratosis: Secondary | ICD-10-CM | POA: Diagnosis not present

## 2024-03-19 DIAGNOSIS — Z23 Encounter for immunization: Secondary | ICD-10-CM | POA: Diagnosis not present

## 2024-04-11 DIAGNOSIS — I83893 Varicose veins of bilateral lower extremities with other complications: Secondary | ICD-10-CM | POA: Diagnosis not present

## 2024-04-11 DIAGNOSIS — R600 Localized edema: Secondary | ICD-10-CM | POA: Diagnosis not present

## 2024-04-14 DIAGNOSIS — H5202 Hypermetropia, left eye: Secondary | ICD-10-CM | POA: Diagnosis not present

## 2024-04-14 DIAGNOSIS — H35372 Puckering of macula, left eye: Secondary | ICD-10-CM | POA: Diagnosis not present

## 2024-04-14 DIAGNOSIS — H2513 Age-related nuclear cataract, bilateral: Secondary | ICD-10-CM | POA: Diagnosis not present

## 2024-04-14 DIAGNOSIS — H52223 Regular astigmatism, bilateral: Secondary | ICD-10-CM | POA: Diagnosis not present

## 2024-04-14 DIAGNOSIS — H5211 Myopia, right eye: Secondary | ICD-10-CM | POA: Diagnosis not present

## 2024-04-14 DIAGNOSIS — H524 Presbyopia: Secondary | ICD-10-CM | POA: Diagnosis not present

## 2024-04-14 DIAGNOSIS — H43813 Vitreous degeneration, bilateral: Secondary | ICD-10-CM | POA: Diagnosis not present

## 2024-04-19 DIAGNOSIS — K219 Gastro-esophageal reflux disease without esophagitis: Secondary | ICD-10-CM | POA: Diagnosis not present

## 2024-04-19 DIAGNOSIS — R0789 Other chest pain: Secondary | ICD-10-CM | POA: Diagnosis not present

## 2024-04-19 DIAGNOSIS — R002 Palpitations: Secondary | ICD-10-CM | POA: Diagnosis not present

## 2024-04-19 DIAGNOSIS — E785 Hyperlipidemia, unspecified: Secondary | ICD-10-CM | POA: Diagnosis not present

## 2024-04-19 DIAGNOSIS — F419 Anxiety disorder, unspecified: Secondary | ICD-10-CM | POA: Diagnosis not present

## 2024-04-19 DIAGNOSIS — E039 Hypothyroidism, unspecified: Secondary | ICD-10-CM | POA: Diagnosis not present

## 2024-04-19 DIAGNOSIS — E663 Overweight: Secondary | ICD-10-CM | POA: Diagnosis not present

## 2024-04-19 DIAGNOSIS — M26659 Arthropathy of unspecified temporomandibular joint: Secondary | ICD-10-CM | POA: Diagnosis not present

## 2024-04-19 DIAGNOSIS — R0981 Nasal congestion: Secondary | ICD-10-CM | POA: Diagnosis not present

## 2024-04-19 DIAGNOSIS — I2510 Atherosclerotic heart disease of native coronary artery without angina pectoris: Secondary | ICD-10-CM | POA: Diagnosis not present

## 2024-04-25 DIAGNOSIS — I83892 Varicose veins of left lower extremities with other complications: Secondary | ICD-10-CM | POA: Diagnosis not present

## 2024-05-02 DIAGNOSIS — I83892 Varicose veins of left lower extremities with other complications: Secondary | ICD-10-CM | POA: Diagnosis not present

## 2024-05-12 DIAGNOSIS — I83891 Varicose veins of right lower extremities with other complications: Secondary | ICD-10-CM | POA: Diagnosis not present

## 2024-05-13 DIAGNOSIS — R635 Abnormal weight gain: Secondary | ICD-10-CM | POA: Diagnosis not present

## 2024-05-13 DIAGNOSIS — Z6825 Body mass index (BMI) 25.0-25.9, adult: Secondary | ICD-10-CM | POA: Diagnosis not present
